# Patient Record
Sex: Female | Born: 1960 | Race: White | Hispanic: No | Marital: Married | Smoking: Never smoker
Health system: Southern US, Community
[De-identification: ages and names within clinical notes are randomized; demographics above are authoritative.]

## PROBLEM LIST (undated history)

## (undated) DIAGNOSIS — IMO0002 Reserved for concepts with insufficient information to code with codable children: Secondary | ICD-10-CM

## (undated) DIAGNOSIS — N390 Urinary tract infection, site not specified: Secondary | ICD-10-CM

## (undated) DIAGNOSIS — E079 Disorder of thyroid, unspecified: Secondary | ICD-10-CM

## (undated) DIAGNOSIS — K760 Fatty (change of) liver, not elsewhere classified: Secondary | ICD-10-CM

## (undated) DIAGNOSIS — N8189 Other female genital prolapse: Secondary | ICD-10-CM

## (undated) DIAGNOSIS — A499 Bacterial infection, unspecified: Secondary | ICD-10-CM

## (undated) DIAGNOSIS — R5383 Other fatigue: Secondary | ICD-10-CM

## (undated) HISTORY — DX: Other female genital prolapse: N81.89

## (undated) HISTORY — DX: Reserved for concepts with insufficient information to code with codable children: IMO0002

## (undated) HISTORY — DX: Other fatigue: R53.83

## (undated) HISTORY — PX: OTHER SURGICAL HISTORY: SHX169

## (undated) HISTORY — DX: Disorder of thyroid, unspecified: E07.9

## (undated) HISTORY — PX: HERNIA REPAIR: SHX51

## (undated) HISTORY — PX: TUBAL LIGATION: SHX77

## (undated) HISTORY — PX: ABDOMINAL HYSTERECTOMY: SHX81

## (undated) HISTORY — DX: Fatty (change of) liver, not elsewhere classified: K76.0

## (undated) HISTORY — PX: WISDOM TOOTH EXTRACTION: SHX21

---

## 1999-04-07 ENCOUNTER — Ambulatory Visit: Admission: RE | Admit: 1999-04-07 | Discharge: 1999-04-07 | Payer: Self-pay | Admitting: Gynecology

## 1999-04-13 ENCOUNTER — Encounter (INDEPENDENT_AMBULATORY_CARE_PROVIDER_SITE_OTHER): Payer: Self-pay

## 1999-04-13 ENCOUNTER — Inpatient Hospital Stay (HOSPITAL_COMMUNITY): Admission: RE | Admit: 1999-04-13 | Discharge: 1999-04-17 | Payer: Self-pay | Admitting: Gynecology

## 1999-05-25 ENCOUNTER — Ambulatory Visit: Admission: RE | Admit: 1999-05-25 | Discharge: 1999-05-25 | Payer: Self-pay | Admitting: Gynecology

## 2002-04-29 ENCOUNTER — Other Ambulatory Visit: Admission: RE | Admit: 2002-04-29 | Discharge: 2002-04-29 | Payer: Self-pay | Admitting: Dermatology

## 2004-10-22 ENCOUNTER — Ambulatory Visit (HOSPITAL_COMMUNITY): Admission: RE | Admit: 2004-10-22 | Discharge: 2004-10-22 | Payer: Self-pay | Admitting: Family Medicine

## 2004-11-02 ENCOUNTER — Ambulatory Visit: Payer: Self-pay | Admitting: Internal Medicine

## 2004-11-09 ENCOUNTER — Ambulatory Visit (HOSPITAL_COMMUNITY): Admission: RE | Admit: 2004-11-09 | Discharge: 2004-11-09 | Payer: Self-pay | Admitting: Internal Medicine

## 2004-11-09 ENCOUNTER — Ambulatory Visit: Payer: Self-pay | Admitting: Internal Medicine

## 2004-12-30 ENCOUNTER — Ambulatory Visit (HOSPITAL_COMMUNITY): Admission: RE | Admit: 2004-12-30 | Discharge: 2004-12-30 | Payer: Self-pay | Admitting: Family Medicine

## 2007-01-19 ENCOUNTER — Ambulatory Visit (HOSPITAL_COMMUNITY): Admission: RE | Admit: 2007-01-19 | Discharge: 2007-01-19 | Payer: Self-pay | Admitting: Obstetrics and Gynecology

## 2009-07-16 ENCOUNTER — Ambulatory Visit (HOSPITAL_COMMUNITY): Admission: RE | Admit: 2009-07-16 | Discharge: 2009-07-16 | Payer: Self-pay | Admitting: Obstetrics and Gynecology

## 2010-01-25 ENCOUNTER — Ambulatory Visit (HOSPITAL_COMMUNITY): Admission: RE | Admit: 2010-01-25 | Discharge: 2010-01-25 | Payer: Self-pay | Admitting: Internal Medicine

## 2010-02-01 ENCOUNTER — Ambulatory Visit (HOSPITAL_COMMUNITY): Admission: RE | Admit: 2010-02-01 | Discharge: 2010-02-01 | Payer: Self-pay | Admitting: Internal Medicine

## 2010-05-03 ENCOUNTER — Ambulatory Visit (HOSPITAL_COMMUNITY)
Admission: RE | Admit: 2010-05-03 | Discharge: 2010-05-03 | Payer: Self-pay | Source: Home / Self Care | Admitting: Family Medicine

## 2010-05-14 ENCOUNTER — Ambulatory Visit (HOSPITAL_COMMUNITY)
Admission: RE | Admit: 2010-05-14 | Discharge: 2010-05-14 | Payer: Self-pay | Source: Home / Self Care | Attending: General Surgery | Admitting: General Surgery

## 2010-08-16 LAB — CBC
HCT: 42.2 % (ref 36.0–46.0)
Hemoglobin: 14.3 g/dL (ref 12.0–15.0)
MCV: 87 fL (ref 78.0–100.0)
WBC: 10.1 10*3/uL (ref 4.0–10.5)

## 2010-08-16 LAB — BASIC METABOLIC PANEL
BUN: 19 mg/dL (ref 6–23)
Chloride: 100 mEq/L (ref 96–112)
GFR calc non Af Amer: 58 mL/min — ABNORMAL LOW (ref >60)
Potassium: 4.7 mEq/L (ref 3.5–5.1)
Sodium: 138 mEq/L (ref 135–145)

## 2010-08-16 LAB — SURGICAL PCR SCREEN: MRSA, PCR: NEGATIVE

## 2010-10-22 NOTE — Consult Note (Signed)
Gwendolyn Sweeney, Gwendolyn Sweeney               ACCOUNT NO.:  [id number]   MEDICAL RECORD NO.:  [id number]          PATIENT TYPE:  AMB   LOCATION:  DAY                           FACILITY:  [facility]   PHYSICIAN:  Lionel December, M.D.    DATE OF BIRTH:  December 23, 1960   DATE OF CONSULTATION:  11/02/2004  DATE OF DISCHARGE:                                   CONSULTATION   REASON FOR CONSULTATION:  Hemoccult positive stool.   HISTORY OF PRESENT ILLNESS:  Gwendolyn Sweeney is a 50 year old Caucasian female  who, around May 1, developed five days of severe upper abdominal pain which  she describes as an aching or hurting along with nausea and vomiting,  severe epigastric  and dark melanotic stools.  She was seen by Dr. Regino Schultze,  and stool was hemoccult positive.  She was felt to have a urinary tract  infection as well, and was started on antibiotics for this.  She denied any  diarrhea.  She did have significant fatigue and anorexia at that time.  She  was started on Nexium 40 mg b.i.d. which she took for several days, but has  since discontinued.  Her appetite has gradually  become somewhat better.  She denies any further melena, and her malaise and fatigue is improving  somewhat.  She does continue to have intermittent nausea.  Her weight has  remained stable.   PAST MEDICAL HISTORY:  1.  Arthritis with hip pain.  2.  Melanoma to the left cervical neck.  3.  Bilateral carpal tunnel repair.  4.  Wisdom teeth extraction.  5.  Tubal ligation.  6.  Hysterectomy, about five years ago.  She had a unilateral oophorectomy      secondary to tubal pregnancy and ovarian cyst.  She does not remember      which ovary was removed.   CURRENT MEDICATIONS:  1.  Vitamins daily.  2.  Os-Cal b.i.d.  3.  Nexium 40 mg daily.  4.  Ibuprofen 200 mg tablets p.r.n.   ALLERGIES:  No known drug allergies.   FAMILY HISTORY:  No known family history of colorectal carcinoma.  Her  mother does have history of peptic ulcer disease, and  is in her early 1's.  Father deceased at age 47 secondary to coronary artery disease and MI.  She  has one healthy sister and brother.   SOCIAL HISTORY:  Gwendolyn Sweeney has been married for 28 years.  She has three  children ages 34, 76, 26.  She a self employed Visual merchandiser, and also Production designer, theatre/television/film of [employer].  She denies any tobacco, alcohol or drug use.   REVIEW OF SYSTEMS:  CONSTITUTIONAL:  Weight is stable.  Denies any anorexia.  Fatigue is getting much better.  Denies any fever or chills.  CARDIOVASCULAR:  Denies any chest pain or palpitations.  PULMONARY:  Denies  any shortness of breath, dyspnea, cough or hemoptysis.  GI:  See HPI.  Denies any heartburn or indigestion, dysphagia or odynophagia at this time.   PHYSICAL EXAMINATION:  VITAL SIGNS:  Weight 178.5 pounds, height 59 inches,  temperature 97.9, blood pressure 120/64, pulse 78.  GENERAL APPEARANCE:  Gwendolyn Sweeney is a 50 year old well-developed, well-  nourished Caucasian female in no acute distress.  HEENT:  Sclerae are clear, nonicteric.  Conjunctivae are pink.  Oropharynx  pink and moist without any lesions.  NECK:  Supple without any mass or thyromegaly.  CHEST:  Heart regular rate and rhythm with normal S1, S2, without any  murmurs, clicks, rubs or gallops.  LUNGS:  Clear to auscultation bilaterally.  ABDOMEN:  Positive bowel sounds x4.  No bruits auscultated.  Soft,  nontender, nondistended without any palpable mass or hepatosplenomegaly.  No  rebound tenderness or guarding.  EXTREMITIES:  2+ pedal pulses bilaterally.  No edema.  SKIN:  Pink, warm and dry without any rash or jaundice.   LABORATORY STUDIES:  She had an upper abdominal ultrasound on Oct 22, 2004,  which was normal.  CBC from Oct 19, 2004, WBC 11.1, hemoglobin 14.3,  hematocrit 43.6.  Sodium 134, potassium 3.8, chloride 96, CO2 29, glucose  89, BUN 22, creatinine 1.1, total bilirubin 0.5, alk-phos 67, AST 27, ALT  38, total protein 7.6, albumin 4.4,  calcium 9.3.   ASSESSMENT:  Gwendolyn Sweeney is a 50 year old Caucasian female with an acute  illness of five days duration earlier this month.  She had sudden onset  upper abdominal pain, severe nausea and vomiting, __________ and melena.  I  suspect she had either gastroenteritis or food borne illness.  Hemoccult  positive stool may have been the result of this, or she may have developed a  Mallory-Weiss tear.  Given her hemoccult positive stool, however, she would  like to proceed with colonoscopy and EGD to rule out peptic ulcer disease or  lower GI tract bleeding.   RECOMMENDATIONS:  1.  She should resume her Nexium 40 mg daily for now.  2.  Will scheduled colonoscopy and EGD with Dr Karilyn Cota in the near future.  I      have discussed both procedures including risks and benefits including      but not limited to bleeding, infection, perforation, drug reaction.  She      agrees with this plan.  Consent will be obtained.  3.  Further recommendations pending procedures.   We would like to thank Dr. Regino Schultze for allowing [place] to participate in the  care of Gwendolyn Sweeney.       KC/MEDQ  D:  11/03/2004  T:  11/03/2004  Job:  [id number]

## 2010-10-22 NOTE — Op Note (Signed)
Gwendolyn Sweeney, Gwendolyn Sweeney               ACCOUNT NO.:  [id number]   MEDICAL RECORD NO.:  [id number]          PATIENT TYPE:  AMB   LOCATION:  DAY                           FACILITY:  [facility]   PHYSICIAN:  Lionel December, M.D.    DATE OF BIRTH:  31-Dec-1960   DATE OF PROCEDURE:  11/09/2004  DATE OF DISCHARGE:                                 OPERATIVE REPORT   PROCEDURE:  Esophagogastroduodenoscopy followed by colonoscopy.   INDICATION:  Gwendolyn Sweeney is a 50 year old Caucasian female who recently had dark  stools for two days. She also has had epigastric pain. She uses NSAIDs on  p.r.n. basis. She was seen by Dr. Regino Schultze. Her stool was not black but was  positive. She was therefore referred for diagnostic evaluation. She took  Nexium for awhile, but she is not taking it any more. She is on Voltaren  which she uses sporadically for hip arthritis.   Procedure and risks were reviewed with the patient, and informed consent was  obtained.   PREMEDICATION:  Cetacaine spray for pharyngeal topical anesthesia, Demerol  25 mg IV, Versed 8 mg IV.   FINDINGS:  Procedures performed in endoscopy suite. The patient's vital  signs and O2 saturation were monitored during the procedure and remained  stable. The patient was placed in left lateral position and remained stable.   PROCEDURE #1:  Esophagogastroduodenoscopy. The patient was placed in left  lateral position and Olympus videoscope was passed via oropharynx without  any difficulty into esophagus.   Esophagus. Mucosa of the esophagus was normal throughout. GE junction was  located at 37 cm.   Stomach. It was empty and distended very well insufflation. Folds of  proximal stomach were normal. Examination of mucosa revealed a small ulcer  surrounded by converging folds and scarring consistent with partially healed  ulcer. Antral erosions were noted as well. Pyloric channel was patent.  Angularis, fundus and cardia were examined by retroflexing the scope and  were normal.   Duodenum. Bulbar mucosa was normal. Scope was passed to the second part of  duodenum where mucosa and folds were normal. Endoscope was withdrawn, and  the patient prepared for procedure #2.   PROCEDURE #2:  Colonoscopy. The rectal examination performed. No abnormality  noted on external or digital exam. Olympus videoscope was placed in rectum  and advanced under vision into sigmoid colon and beyond. Preparation was  excellent. Scope was passed to cecum which was identified by appendiceal  orifice and ileocecal valve. Pictures taken for the record. As the scope was  withdrawn, colonic mucosa was carefully examined and was normal throughout.  Rectal mucosa similarly was normal. Scope was retroflexed to examine  anorectal junction, and small hemorrhoids were noted below the dentate line.  Endoscope was then withdrawn. The patient tolerated the procedures well.   FINAL DIAGNOSIS:  Healing gastric ulcer at antrum along with antral  erosions. Suspect NSAID-induced injury, but H pylori infection needs to be  ruled out.   Normal colonoscopy except for small external hemorrhoids.   RECOMMENDATIONS:  1.  The patient advised to go back on Nexium 40 mg  p.o. q.a.m. and stay on      it for at least two weeks; however, if she has take NSAID frequently or      daily, she should stay on Nexium indefinitely. Prescription given for 30      with 11 refills.  2.  H pylori serology will be checked today, and we will contact the patient      with results.       NR/MEDQ  D:  11/09/2004  T:  11/09/2004  Job:  [id number]   cc:   Kirk Ruths, M.D.  [address]  [place]  [place] [address]  Fax: [phone]

## 2012-02-01 ENCOUNTER — Other Ambulatory Visit: Payer: Self-pay | Admitting: Adult Health

## 2012-02-01 DIAGNOSIS — Z139 Encounter for screening, unspecified: Secondary | ICD-10-CM

## 2012-02-13 ENCOUNTER — Ambulatory Visit (HOSPITAL_COMMUNITY): Payer: Self-pay

## 2012-02-20 ENCOUNTER — Ambulatory Visit (HOSPITAL_COMMUNITY): Payer: Self-pay

## 2012-03-05 ENCOUNTER — Ambulatory Visit (HOSPITAL_COMMUNITY)
Admission: RE | Admit: 2012-03-05 | Discharge: 2012-03-05 | Disposition: A | Discharge status: Home / Self Care | Payer: PRIVATE HEALTH INSURANCE | Source: Ambulatory Visit | Attending: Family Medicine | Admitting: Family Medicine

## 2012-03-05 ENCOUNTER — Other Ambulatory Visit (HOSPITAL_COMMUNITY): Payer: Self-pay | Admitting: Family Medicine

## 2012-03-05 DIAGNOSIS — R059 Cough, unspecified: Secondary | ICD-10-CM

## 2012-03-05 DIAGNOSIS — J069 Acute upper respiratory infection, unspecified: Secondary | ICD-10-CM

## 2012-03-05 DIAGNOSIS — R0989 Other specified symptoms and signs involving the circulatory and respiratory systems: Secondary | ICD-10-CM | POA: Insufficient documentation

## 2012-03-05 DIAGNOSIS — R05 Cough: Secondary | ICD-10-CM

## 2012-05-15 ENCOUNTER — Ambulatory Visit (HOSPITAL_COMMUNITY)
Admission: RE | Admit: 2012-05-15 | Discharge: 2012-05-15 | Disposition: A | Discharge status: Home / Self Care | Payer: PRIVATE HEALTH INSURANCE | Source: Ambulatory Visit | Attending: Adult Health | Admitting: Adult Health

## 2012-05-15 DIAGNOSIS — Z139 Encounter for screening, unspecified: Secondary | ICD-10-CM

## 2012-05-15 DIAGNOSIS — Z1231 Encounter for screening mammogram for malignant neoplasm of breast: Secondary | ICD-10-CM | POA: Insufficient documentation

## 2012-05-17 ENCOUNTER — Ambulatory Visit (HOSPITAL_COMMUNITY): Payer: PRIVATE HEALTH INSURANCE

## 2012-10-17 ENCOUNTER — Other Ambulatory Visit: Payer: Self-pay | Admitting: Adult Health

## 2012-10-17 MED ORDER — SULFAMETHOXAZOLE-TRIMETHOPRIM 800-160 MG PO TABS: 1.0000 | ORAL_TABLET | Freq: Two times a day (BID) | ORAL | Status: DC

## 2013-01-16 ENCOUNTER — Other Ambulatory Visit: Payer: Self-pay | Admitting: *Deleted

## 2013-01-16 MED ORDER — ZOLPIDEM TARTRATE 5 MG PO TABS: 5.0000 mg | ORAL_TABLET | Freq: Every evening | ORAL | Status: DC | PRN

## 2013-04-08 ENCOUNTER — Ambulatory Visit (INDEPENDENT_AMBULATORY_CARE_PROVIDER_SITE_OTHER): Payer: PRIVATE HEALTH INSURANCE | Admitting: Adult Health

## 2013-04-08 ENCOUNTER — Encounter: Payer: Self-pay | Admitting: Adult Health

## 2013-04-08 ENCOUNTER — Encounter (INDEPENDENT_AMBULATORY_CARE_PROVIDER_SITE_OTHER): Payer: Self-pay

## 2013-04-08 VITALS — BP 132/84 | HR 72 | Ht 60.0 in | Wt 184.5 lb

## 2013-04-08 DIAGNOSIS — Z1212 Encounter for screening for malignant neoplasm of rectum: Secondary | ICD-10-CM

## 2013-04-08 DIAGNOSIS — R5383 Other fatigue: Secondary | ICD-10-CM

## 2013-04-08 DIAGNOSIS — Z01419 Encounter for gynecological examination (general) (routine) without abnormal findings: Secondary | ICD-10-CM

## 2013-04-08 DIAGNOSIS — Z1389 Encounter for screening for other disorder: Secondary | ICD-10-CM

## 2013-04-08 HISTORY — DX: Other fatigue: R53.83

## 2013-04-08 LAB — HEMOCCULT GUIAC POC 1CARD (OFFICE): Fecal Occult Blood, POC: NEGATIVE

## 2013-04-08 LAB — COMPREHENSIVE METABOLIC PANEL
AST: 21 U/L (ref 0–37)
BUN: 15 mg/dL (ref 6–23)
Calcium: 9.6 mg/dL (ref 8.4–10.5)
Chloride: 103 mEq/L (ref 96–112)
Creat: 1.03 mg/dL (ref 0.50–1.10)
Total Bilirubin: 0.4 mg/dL (ref 0.3–1.2)

## 2013-04-08 LAB — CBC
HCT: 40.2 % (ref 36.0–46.0)
MCH: 28.5 pg (ref 26.0–34.0)
MCV: 86.1 fL (ref 78.0–100.0)
RDW: 13.9 % (ref 11.5–15.5)
WBC: 9.9 10*3/uL (ref 4.0–10.5)

## 2013-04-08 LAB — LIPID PANEL
HDL: 54 mg/dL (ref >39)
LDL Cholesterol: 101 mg/dL — ABNORMAL HIGH (ref 0–99)
Total CHOL/HDL Ratio: 3.2 Ratio
Triglycerides: 96 mg/dL (ref <150)

## 2013-04-08 LAB — POCT URINALYSIS DIPSTICK
Blood, UA: NEGATIVE
Protein, UA: NEGATIVE

## 2013-04-08 NOTE — Progress Notes (Signed)
Patient ID: Gwendolyn Sweeney, female   DOB: [birth year], 52 y.o.   MRN: [id number] History of Present Illness: Gwendolyn Sweeney is a 52 year old white female married in for physical.She is complaining of fatigue but has just finished tobacco season.   Current Medications, Allergies, Past Medical History, Past Surgical History, Family History and Social History were reviewed in [employer] record.     Review of Systems: Patient denies any headaches, blurred vision, shortness of breath, chest pain, abdominal pain, problems with bowel movements, urination, or intercourse. She has pain joints of fingers,occasional hot flash, no mood changes.Feels tired.    Physical Exam:BP 132/84  Pulse 72  Ht 5' (1.524 m)  Wt 184 lb 8 oz (83.689 kg)  BMI 36.03 kg/m2 General:  Well developed, well nourished, no acute distress Skin:  Warm and dry Neck:  Midline trachea, normal thyroid Lungs; Clear to auscultation bilaterally Breast:  No dominant palpable mass, retraction, or nipple discharge Cardiovascular: Regular rate and rhythm Abdomen:  Soft, non tender, no hepatosplenomegaly,obese Pelvic:  External genitalia is normal in appearance.  The vagina is normal in appearance.  The cervix and uterus are absent.  No  adnexal masses or tenderness noted. Rectal: Good sphincter tone, no polyps, or hemorrhoids felt.  Hemoccult negative. Extremities:  No swelling or varicosities noted in feet and legs, she has some swelling in fingers and stiffness in several fingers, but declines referral at this time Psych:  No mood changes, alert and cooperative   Impression: Yearly gyn exam Fatigue  Plan: Physical in 1 year Mammogram yearly Colonoscopy as per GI Check CBC,CMP,TSH and lipids Follow up labs in am by phone

## 2013-04-08 NOTE — Patient Instructions (Signed)
Physical in 1 year Mammogram yearly  Colonoscopy due Follow up labs in am

## 2013-04-09 ENCOUNTER — Telehealth: Payer: Self-pay | Admitting: Adult Health

## 2013-04-09 LAB — TSH: TSH: 2.803 u[IU]/mL (ref 0.350–4.500)

## 2013-04-09 NOTE — Telephone Encounter (Signed)
Left message labs good will mail copy 

## 2013-04-10 ENCOUNTER — Telehealth: Payer: Self-pay | Admitting: Adult Health

## 2013-04-10 NOTE — Telephone Encounter (Signed)
Pt aware of labs will mail copy to her and fax to Dr Regino Schultze

## 2013-07-19 ENCOUNTER — Other Ambulatory Visit: Payer: Self-pay | Admitting: Obstetrics and Gynecology

## 2013-07-19 DIAGNOSIS — Z1231 Encounter for screening mammogram for malignant neoplasm of breast: Secondary | ICD-10-CM

## 2013-07-23 ENCOUNTER — Ambulatory Visit (HOSPITAL_COMMUNITY): Payer: PRIVATE HEALTH INSURANCE

## 2013-07-26 ENCOUNTER — Ambulatory Visit (HOSPITAL_COMMUNITY)
Admission: RE | Admit: 2013-07-26 | Discharge: 2013-07-26 | Disposition: A | Discharge status: Home / Self Care | Payer: PRIVATE HEALTH INSURANCE | Source: Ambulatory Visit | Attending: Obstetrics and Gynecology | Admitting: Obstetrics and Gynecology

## 2013-07-26 DIAGNOSIS — Z1231 Encounter for screening mammogram for malignant neoplasm of breast: Secondary | ICD-10-CM | POA: Insufficient documentation

## 2013-12-20 ENCOUNTER — Telehealth: Payer: Self-pay | Admitting: Adult Health

## 2013-12-20 MED ORDER — ZOLPIDEM TARTRATE 5 MG PO TABS: 5.0000 mg | ORAL_TABLET | Freq: Every evening | ORAL | Status: DC | PRN

## 2013-12-20 MED ORDER — AZITHROMYCIN 250 MG PO TABS: ORAL_TABLET | ORAL | Status: DC

## 2013-12-20 NOTE — Telephone Encounter (Signed)
Needs refill on Ambien and complains of sinus infection, teeth hurt and eyes puffy will Rx Z pack and refill Lorrin Mais

## 2014-04-07 ENCOUNTER — Encounter: Payer: Self-pay | Admitting: Adult Health

## 2014-12-10 ENCOUNTER — Other Ambulatory Visit (HOSPITAL_COMMUNITY): Payer: Self-pay | Admitting: Family Medicine

## 2014-12-10 DIAGNOSIS — Z1231 Encounter for screening mammogram for malignant neoplasm of breast: Secondary | ICD-10-CM

## 2014-12-17 ENCOUNTER — Ambulatory Visit (HOSPITAL_COMMUNITY): Payer: PRIVATE HEALTH INSURANCE

## 2014-12-25 ENCOUNTER — Inpatient Hospital Stay (HOSPITAL_COMMUNITY): Admission: RE | Admit: 2014-12-25 | Payer: PRIVATE HEALTH INSURANCE | Source: Ambulatory Visit

## 2015-01-12 ENCOUNTER — Telehealth: Payer: Self-pay | Admitting: *Deleted

## 2015-01-12 MED ORDER — SULFAMETHOXAZOLE-TRIMETHOPRIM 800-160 MG PO TABS: 1.0000 | ORAL_TABLET | Freq: Two times a day (BID) | ORAL | Status: DC

## 2015-01-12 MED ORDER — NYSTATIN-TRIAMCINOLONE 100000-0.1 UNIT/GM-% EX CREA: 1.0000 "application " | TOPICAL_CREAM | Freq: Two times a day (BID) | CUTANEOUS | Status: DC

## 2015-01-12 NOTE — Telephone Encounter (Signed)
Has burning with urination and vaginal irritation, can't come in will rx septra ds and mytrex, if not better will have to come in

## 2015-01-12 NOTE — Telephone Encounter (Signed)
Spoke with pt. I advised she would need to be seen but pt states she has been out of work lately and she can't take off. Please advise. Thanks!! [place]

## 2015-02-18 ENCOUNTER — Ambulatory Visit (HOSPITAL_COMMUNITY)
Admission: RE | Admit: 2015-02-18 | Discharge: 2015-02-18 | Disposition: A | Discharge status: Home / Self Care | Payer: PRIVATE HEALTH INSURANCE | Source: Ambulatory Visit | Attending: Family Medicine | Admitting: Family Medicine

## 2015-02-18 ENCOUNTER — Ambulatory Visit (HOSPITAL_COMMUNITY): Payer: PRIVATE HEALTH INSURANCE

## 2015-02-18 ENCOUNTER — Other Ambulatory Visit (HOSPITAL_COMMUNITY): Payer: Self-pay | Admitting: Family Medicine

## 2015-02-18 DIAGNOSIS — Z1231 Encounter for screening mammogram for malignant neoplasm of breast: Secondary | ICD-10-CM | POA: Diagnosis not present

## 2015-03-23 ENCOUNTER — Encounter: Payer: Self-pay | Admitting: Adult Health

## 2015-03-23 ENCOUNTER — Ambulatory Visit (INDEPENDENT_AMBULATORY_CARE_PROVIDER_SITE_OTHER): Payer: PRIVATE HEALTH INSURANCE | Admitting: Adult Health

## 2015-03-23 VITALS — BP 134/88 | HR 68 | Ht 59.5 in | Wt 183.5 lb

## 2015-03-23 DIAGNOSIS — Z1212 Encounter for screening for malignant neoplasm of rectum: Secondary | ICD-10-CM

## 2015-03-23 DIAGNOSIS — Z01419 Encounter for gynecological examination (general) (routine) without abnormal findings: Secondary | ICD-10-CM

## 2015-03-23 DIAGNOSIS — N8189 Other female genital prolapse: Secondary | ICD-10-CM

## 2015-03-23 HISTORY — DX: Other female genital prolapse: N81.89

## 2015-03-23 LAB — HEMOCCULT GUIAC POC 1CARD (OFFICE): Fecal Occult Blood, POC: NEGATIVE

## 2015-03-23 NOTE — Patient Instructions (Signed)
Physical in 1 year Mammogram yearly Colonoscopy per GI 

## 2015-03-23 NOTE — Progress Notes (Signed)
Patient ID: Gwendolyn Sweeney, female   DOB: 1960-10-18, 54 y.o.   MRN: [id number] History of Present Illness: Gwendolyn Sweeney is a 54 year old white female,married in for a well woman gyn exam, she is sp hysterectomy. PCP is Dr Hilma Favors.  Current Medications, Allergies, Past Medical History, Past Surgical History, Family History and Social History were reviewed in [employer] record.     Review of Systems: Patient denies any headaches, hearing loss, fatigue, blurred vision, shortness of breath, chest pain, abdominal pain, problems with bowel movements,  or intercourse. No joint pain or mood swings. Has had frequent UTIs, and voids often,has some pain in right shoulder at times, but has been on tractor lately with tobacco harvesting, has some back and leg aches at times.She says still uses Ambien on occasion and it helps.   Physical Exam:BP 134/88 mmHg  Pulse 68  Ht 4' 11.5" (1.511 m)  Wt 183 lb 8 oz (83.235 kg)  BMI 36.46 kg/m2 General:  Well developed, well nourished, no acute distress Skin:  Warm and dry Neck:  Midline trachea, normal thyroid, good ROM, no lymphadenopathy Lungs; Clear to auscultation bilaterally Breast:  No dominant palpable mass, retraction, or nipple discharge Cardiovascular: Regular rate and rhythm Abdomen:  Soft, non tender, no hepatosplenomegaly Pelvic:  External genitalia is normal in appearance, no lesions.  The vagina has decreased color, moisture and rugae and she has pelvic relaxation. Urethra has no lesions or masses. The cervix and uterus are absent. No adnexal masses or tenderness noted.Bladder is non tender, no masses felt. Rectal: Good sphincter tone, no polyps, or hemorrhoids felt.  Hemoccult negative.+rectocele Extremities/musculoskeletal:  No swelling or varicosities noted, no clubbing or cyanosis Psych:  No mood changes, alert and cooperative,seems happy Discussed pelvic relaxation with her and pessary use,instead of  surgery.  Impression:  Well woman gyn exam no pap Pelvic relaxation    Plan: Check CBC,CMP,TSH and lipids Physical in 1 year Mammogram yearly Colonoscopy per GI Call if wants to try a pessary

## 2015-03-24 ENCOUNTER — Telehealth: Payer: Self-pay | Admitting: Adult Health

## 2015-03-24 LAB — COMPREHENSIVE METABOLIC PANEL
ALBUMIN: 4.4 g/dL (ref 3.5–5.5)
ALK PHOS: 69 IU/L (ref 39–117)
ALT: 34 IU/L — AB (ref 0–32)
AST: 30 IU/L (ref 0–40)
Albumin/Globulin Ratio: 1.5 (ref 1.1–2.5)
BUN/Creatinine Ratio: 16 (ref 9–23)
BUN: 16 mg/dL (ref 6–24)
Bilirubin Total: 0.4 mg/dL (ref 0.0–1.2)
CO2: 27 mmol/L (ref 18–29)
CREATININE: 0.97 mg/dL (ref 0.57–1.00)
Calcium: 9.5 mg/dL (ref 8.7–10.2)
Chloride: 99 mmol/L (ref 97–106)
GFR, EST AFRICAN AMERICAN: 76 mL/min/{1.73_m2} (ref >59)
GFR, EST NON AFRICAN AMERICAN: 65 mL/min/{1.73_m2} (ref >59)
GLOBULIN, TOTAL: 2.9 g/dL (ref 1.5–4.5)
Glucose: 91 mg/dL (ref 65–99)
Potassium: 4.4 mmol/L (ref 3.5–5.2)
Sodium: 141 mmol/L (ref 136–144)
Total Protein: 7.3 g/dL (ref 6.0–8.5)

## 2015-03-24 LAB — CBC
HEMATOCRIT: 40.8 % (ref 34.0–46.6)
Hemoglobin: 13.4 g/dL (ref 11.1–15.9)
MCH: 28.5 pg (ref 26.6–33.0)
MCHC: 32.8 g/dL (ref 31.5–35.7)
MCV: 87 fL (ref 79–97)
Platelets: 311 10*3/uL (ref 150–379)
RBC: 4.71 x10E6/uL (ref 3.77–5.28)
RDW: 14.8 % (ref 12.3–15.4)
WBC: 8.4 10*3/uL (ref 3.4–10.8)

## 2015-03-24 LAB — LIPID PANEL
CHOL/HDL RATIO: 2.5 ratio (ref 0.0–4.4)
CHOLESTEROL TOTAL: 171 mg/dL (ref 100–199)
HDL: 69 mg/dL (ref >39)
LDL CALC: 88 mg/dL (ref 0–99)
TRIGLYCERIDES: 70 mg/dL (ref 0–149)
VLDL Cholesterol Cal: 14 mg/dL (ref 5–40)

## 2015-03-24 LAB — TSH: TSH: 3.52 u[IU]/mL (ref 0.450–4.500)

## 2015-03-24 NOTE — Telephone Encounter (Signed)
Pt aware labs normal  

## 2015-05-06 ENCOUNTER — Telehealth: Payer: Self-pay | Admitting: Adult Health

## 2015-05-06 NOTE — Telephone Encounter (Signed)
Told pt may want to try pessary now, she will think about it and call for appt

## 2015-05-18 ENCOUNTER — Encounter (INDEPENDENT_AMBULATORY_CARE_PROVIDER_SITE_OTHER): Payer: Self-pay | Admitting: *Deleted

## 2015-07-10 ENCOUNTER — Telehealth: Payer: Self-pay | Admitting: *Deleted

## 2015-07-10 MED ORDER — ZOLPIDEM TARTRATE 5 MG PO TABS: 5.0000 mg | ORAL_TABLET | Freq: Every evening | ORAL | Status: DC | PRN

## 2015-07-10 MED ORDER — ESTRADIOL 1 MG PO TABS: 1.0000 mg | ORAL_TABLET | Freq: Every day | ORAL | Status: DC

## 2015-07-10 NOTE — Telephone Encounter (Signed)
Having hot flashes, decreased libido and not sleeping well wants to try ET and refill ambien, she is aware of risk and benefits of ET

## 2015-10-14 ENCOUNTER — Other Ambulatory Visit (INDEPENDENT_AMBULATORY_CARE_PROVIDER_SITE_OTHER): Payer: Self-pay | Admitting: *Deleted

## 2015-10-14 DIAGNOSIS — Z1211 Encounter for screening for malignant neoplasm of colon: Secondary | ICD-10-CM

## 2015-12-09 ENCOUNTER — Other Ambulatory Visit (INDEPENDENT_AMBULATORY_CARE_PROVIDER_SITE_OTHER): Payer: Self-pay | Admitting: *Deleted

## 2015-12-09 ENCOUNTER — Encounter (INDEPENDENT_AMBULATORY_CARE_PROVIDER_SITE_OTHER): Payer: Self-pay | Admitting: *Deleted

## 2015-12-09 NOTE — Telephone Encounter (Signed)
Patient needs trilyte 

## 2015-12-10 MED ORDER — PEG 3350-KCL-NA BICARB-NACL 420 G PO SOLR: 4000.0000 mL | Freq: Once | ORAL | Status: DC

## 2015-12-23 ENCOUNTER — Telehealth (INDEPENDENT_AMBULATORY_CARE_PROVIDER_SITE_OTHER): Payer: Self-pay | Admitting: *Deleted

## 2015-12-23 NOTE — Telephone Encounter (Signed)
Referring MD/PCP: koberlein   Procedure: tcs  Reason/Indication:  screening  Has patient had this procedure before?  Yes, 2006 -- epic  If so, when, by whom and where?    Is there a family history of colon cancer?  no  Who?  What age when diagnosed?    Is patient diabetic?   no      Does patient have prosthetic heart valve or mechanical valve?  no  Do you have a pacemaker?  no  Has patient ever had endocarditis? no  Has patient had joint replacement within last 12 months?  no  Does patient tend to be constipated or take laxatives? no  Does patient have a history of alcohol/drug use?  no  Is patient on Coumadin, Plavix and/or Aspirin? no  Medications: none  Allergies: nkda  Medication Adjustment:   Procedure date & time: 01/20/16 at 830

## 2015-12-23 NOTE — Telephone Encounter (Signed)
agree

## 2016-01-20 ENCOUNTER — Encounter (HOSPITAL_COMMUNITY)
Admission: RE | Disposition: A | Discharge status: Home / Self Care | Payer: Self-pay | Source: Ambulatory Visit | Attending: Internal Medicine

## 2016-01-20 ENCOUNTER — Ambulatory Visit (HOSPITAL_COMMUNITY)
Admission: RE | Admit: 2016-01-20 | Discharge: 2016-01-20 | Disposition: A | Discharge status: Home / Self Care | Payer: PRIVATE HEALTH INSURANCE | Source: Ambulatory Visit | Attending: Internal Medicine | Admitting: Internal Medicine

## 2016-01-20 ENCOUNTER — Encounter (HOSPITAL_COMMUNITY): Payer: Self-pay | Admitting: *Deleted

## 2016-01-20 DIAGNOSIS — Z8711 Personal history of peptic ulcer disease: Secondary | ICD-10-CM | POA: Diagnosis not present

## 2016-01-20 DIAGNOSIS — D125 Benign neoplasm of sigmoid colon: Secondary | ICD-10-CM | POA: Diagnosis not present

## 2016-01-20 DIAGNOSIS — Z1211 Encounter for screening for malignant neoplasm of colon: Secondary | ICD-10-CM | POA: Diagnosis not present

## 2016-01-20 DIAGNOSIS — Z79899 Other long term (current) drug therapy: Secondary | ICD-10-CM | POA: Diagnosis not present

## 2016-01-20 DIAGNOSIS — K5730 Diverticulosis of large intestine without perforation or abscess without bleeding: Secondary | ICD-10-CM

## 2016-01-20 HISTORY — DX: Bacterial infection, unspecified: A49.9

## 2016-01-20 HISTORY — PX: COLONOSCOPY: SHX5424

## 2016-01-20 HISTORY — DX: Urinary tract infection, site not specified: N39.0

## 2016-01-20 SURGERY — COLONOSCOPY
Anesthesia: Moderate Sedation

## 2016-01-20 MED ORDER — STERILE WATER FOR IRRIGATION IR SOLN
Status: DC | PRN
  Administered 2016-01-20: 100 mL

## 2016-01-20 MED ORDER — MIDAZOLAM HCL 5 MG/5ML IJ SOLN
INTRAMUSCULAR | Status: DC | PRN
  Administered 2016-01-20 (×3): 2 mg via INTRAVENOUS

## 2016-01-20 MED ORDER — MIDAZOLAM HCL 5 MG/5ML IJ SOLN
INTRAMUSCULAR | Status: AC
  Filled 2016-01-20: qty 10

## 2016-01-20 MED ORDER — SODIUM CHLORIDE 0.9 % IV SOLN
INTRAVENOUS | Status: DC
  Administered 2016-01-20: 1000 mL via INTRAVENOUS

## 2016-01-20 MED ORDER — MEPERIDINE HCL 50 MG/ML IJ SOLN
INTRAMUSCULAR | Status: DC | PRN
  Administered 2016-01-20 (×2): 25 mg via INTRAVENOUS

## 2016-01-20 MED ORDER — MEPERIDINE HCL 50 MG/ML IJ SOLN
INTRAMUSCULAR | Status: AC
  Filled 2016-01-20: qty 1

## 2016-01-20 NOTE — Discharge Instructions (Signed)
High-Fiber Diet Fiber, also called dietary fiber, is a type of carbohydrate found in fruits, vegetables, whole grains, and beans. A high-fiber diet can have many health benefits. Your health care provider may recommend a high-fiber diet to help:  Prevent constipation. Fiber can make your bowel movements more regular.  Lower your cholesterol.  Relieve hemorrhoids, uncomplicated diverticulosis, or irritable bowel syndrome.  Prevent overeating as part of a weight-loss plan.  Prevent heart disease, type 2 diabetes, and certain cancers. WHAT IS MY PLAN? The recommended daily intake of fiber includes:  38 grams for men under age 69.  28 grams for men over age 42.  16 grams for women under age 71.  86 grams for women over age 75. You can get the recommended daily intake of dietary fiber by eating a variety of fruits, vegetables, grains, and beans. Your health care provider may also recommend a fiber supplement if it is not possible to get enough fiber through your diet. WHAT DO I NEED TO KNOW ABOUT A HIGH-FIBER DIET?  Fiber supplements have not been widely studied for their effectiveness, so it is better to get fiber through food sources.  Always check the fiber content on thenutrition facts label of any prepackaged food. Look for foods that contain at least 5 grams of fiber per serving.  Ask your dietitian if you have questions about specific foods that are related to your condition, especially if those foods are not listed in the following section.  Increase your daily fiber consumption gradually. Increasing your intake of dietary fiber too quickly may cause bloating, cramping, or gas.  Drink plenty of water. Water helps you to digest fiber. WHAT FOODS CAN I EAT? Grains Whole-grain breads. Multigrain cereal. Oats and oatmeal. Brown rice. Barley. Bulgur wheat. [place]. Bran muffins. Popcorn. Rye wafer crackers. Vegetables Sweet potatoes. Spinach. Kale. Artichokes. Cabbage. Broccoli.  Green peas. Carrots. Squash. Fruits Berries. Pears. Apples. Oranges. Avocados. Prunes and raisins. Dried figs. Meats and Other Protein Sources [place], kidney, pinto, and soy beans. Split peas. Lentils. Nuts and seeds. Dairy Fiber-fortified yogurt. Beverages Fiber-fortified soy milk. Fiber-fortified orange juice. Other Fiber bars. The items listed above may not be a complete list of recommended foods or beverages. Contact your dietitian for more options. WHAT FOODS ARE NOT RECOMMENDED? Grains White bread. Pasta made with refined flour. White rice. Vegetables Fried potatoes. Canned vegetables. Well-cooked vegetables.  Fruits Fruit juice. Cooked, strained fruit. Meats and Other Protein Sources Fatty cuts of meat. Fried Sales executive or fried fish. Dairy Milk. Yogurt. Cream cheese. Sour cream. Beverages Soft drinks. Other Cakes and pastries. Butter and oils. The items listed above may not be a complete list of foods and beverages to avoid. Contact your dietitian for more information. WHAT ARE SOME TIPS FOR INCLUDING HIGH-FIBER FOODS IN MY DIET?  Eat a wide variety of high-fiber foods.  Make sure that half of all grains consumed each day are whole grains.  Replace breads and cereals made from refined flour or white flour with whole-grain breads and cereals.  Replace white rice with brown rice, bulgur wheat, or millet.  Start the day with a breakfast that is high in fiber, such as a cereal that contains at least 5 grams of fiber per serving.  Use beans in place of meat in soups, salads, or pasta.  Eat high-fiber snacks, such as berries, raw vegetables, nuts, or popcorn.   This information is not intended to replace advice given to you by your health care provider. Make sure you discuss  any questions you have with your health care provider.   Document Released: 05/23/2005 Document Revised: 06/13/2014 Document Reviewed: 11/05/2013 Elsevier Interactive Patient Education 2016 Elsevier  Inc. Colonoscopy, Care After Refer to this sheet in the next few weeks. These instructions provide you with information on caring for yourself after your procedure. Your health care provider may also give you more specific instructions. Your treatment has been planned according to current medical practices, but problems sometimes occur. Call your health care provider if you have any problems or questions after your procedure. WHAT TO EXPECT AFTER THE PROCEDURE  After your procedure, it is typical to have the following:  A small amount of blood in your stool.  Moderate amounts of gas and mild abdominal cramping or bloating. HOME CARE INSTRUCTIONS  Do not drive, operate machinery, or sign important documents for 24 hours.  You may shower and resume your regular physical activities, but move at a slower pace for the first 24 hours.  Take frequent rest periods for the first 24 hours.  Walk around or put a warm pack on your abdomen to help reduce abdominal cramping and bloating.  Drink enough fluids to keep your urine clear or pale yellow.  You may resume your normal diet as instructed by your health care provider. Avoid heavy or fried foods that are hard to digest.  Avoid drinking alcohol for 24 hours or as instructed by your health care provider.  Only take over-the-counter or prescription medicines as directed by your health care provider.  If a tissue sample (biopsy) was taken during your procedure:  Do not take aspirin or blood thinners for 7 days, or as instructed by your health care provider.  Do not drink alcohol for 7 days, or as instructed by your health care provider.  Eat soft foods for the first 24 hours. SEEK MEDICAL CARE IF: You have persistent spotting of blood in your stool 2-3 days after the procedure. SEEK IMMEDIATE MEDICAL CARE IF:  You have more than a small spotting of blood in your stool.  You pass large blood clots in your stool.  Your abdomen is swollen  (distended).  You have nausea or vomiting.  You have a fever.  You have increasing abdominal pain that is not relieved with medicine.   This information is not intended to replace advice given to you by your health care provider. Make sure you discuss any questions you have with your health care provider.   Document Released: 01/05/2004 Document Revised: 03/13/2013 Document Reviewed: 01/28/2013 Elsevier Interactive Patient Education 2016 [place] usual medications and high fiber diet. No driving for 24 hours. Physician will call with biopsy results.

## 2016-01-20 NOTE — H&P (Signed)
Gwendolyn Sweeney is an 55 y.o. female.   Chief Complaint: Patient is here for colonoscopy. HPI: Patient is 55 year old Caucasian female who is here for screening colonoscopy. She denies abdominal pain change in bowel habits or rectal bleeding. Last colonoscopy was normal in 2006. History is negative for CRC.  Past Medical History:  Diagnosis Date  . Fatigue 04/08/2013  . 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 Urinary tract bacterial infections     Past Surgical History:  Procedure Laterality Date  . ABDOMINAL HYSTERECTOMY    . carpel tunnel    . HERNIA REPAIR    . TUBAL LIGATION    . WISDOM TOOTH EXTRACTION      Family History  Problem Relation Age of Onset  . Heart disease Mother   . Heart disease Father   . Diabetes Daughter   . Diabetes Paternal Grandfather   . Heart disease Paternal Grandfather   . Macular degeneration Paternal Grandfather   . Glaucoma Maternal Grandmother   . Macular degeneration Sister   . Other Other     paternal 2nd cousin-tested + for gene for breast cancer   Social History:  reports that she has never smoked. She has never used smokeless tobacco. She reports that she does not drink alcohol or use drugs.  Allergies: No Known Allergies  Medications Prior to Admission  Medication Sig Dispense Refill  . esomeprazole (NEXIUM) 40 MG capsule Take 40 mg by mouth as needed.    . polyethylene glycol-electrolytes (TRILYTE) 420 g solution Take 4,000 mLs by mouth once. 4000 mL 0  . sulfamethoxazole-trimethoprim (BACTRIM,SEPTRA) 200-40 MG/5ML suspension Take by mouth 2 (two) times daily.    Marland Kitchen estradiol (ESTRACE) 1 MG tablet Take 1 tablet (1 mg total) by mouth daily. 30 tablet 6  . zolpidem (AMBIEN) 5 MG tablet Take 1 tablet (5 mg total) by mouth at bedtime as needed for sleep. 30 tablet 1    No results found for this or any previous visit (from the past 48 hour(s)). No results found.  ROS  Height 4\' 11"  (1.499 m),  weight 183 lb (83 kg). Physical Exam  Constitutional: She appears well-developed and well-nourished.  HENT:  Mouth/Throat: Oropharynx is clear and moist.  Eyes: Conjunctivae are normal. No scleral icterus.  Neck: No thyromegaly present.  Cardiovascular: Normal rate, regular rhythm and normal heart sounds.   No murmur heard. Respiratory: Effort normal and breath sounds normal.  GI: Soft. She exhibits no distension and no mass. There is no tenderness.  Musculoskeletal: She exhibits no edema.  Lymphadenopathy:    She has no cervical adenopathy.  Neurological: She is alert.  Skin: Skin is warm and dry.     Assessment/Plan Average risk screening colonoscopy.  Hildred Laser, MD 01/20/2016, 8:53 AM

## 2016-01-20 NOTE — Op Note (Signed)
[facility] Patient Name: Gwendolyn Sweeney Procedure Date: 01/20/2016 8:55 AM MRN: [id number] Date of Birth: 07-12-1960 Attending MD: Hildred Laser , MD CSN: [id number] Age: 55 Admit Type: Outpatient Procedure:                Colonoscopy Indications:              Screening for colorectal malignant neoplasm Providers:                Hildred Laser, MD, Rosina Lowenstein, RN, Purcell Nails.                            Tina Griffiths, Technician Referring MD:             Halford Chessman, MD , Derrek Monaco, NP Medicines:                Meperidine 50 mg IV, Midazolam 6 mg IV Complications:            No immediate complications. Estimated Blood Loss:     Estimated blood loss was minimal. Procedure:                Pre-Anesthesia Assessment:                           - Prior to the procedure, a History and Physical                            was performed, and patient medications and                            allergies were reviewed. The patient's tolerance of                            previous anesthesia was also reviewed. The risks                            and benefits of the procedure and the sedation                            options and risks were discussed with the patient.                            All questions were answered, and informed consent                            was obtained. Prior Anticoagulants: The patient has                            taken no previous anticoagulant or antiplatelet                            agents. ASA Grade Assessment: I - A normal, healthy                            patient. After reviewing the risks and benefits,  the patient was deemed in satisfactory condition to                            undergo the procedure.                           After obtaining informed consent, the colonoscope                            was passed under direct vision. Throughout the                            procedure, the patient's blood pressure,  pulse, and                            oxygen saturations were monitored continuously. The                            [id number]) was introduced through the                            anus and advanced to the the cecum, identified by                            appendiceal orifice and ileocecal valve. The                            colonoscopy was performed without difficulty. The                            patient tolerated the procedure well. The quality                            of the bowel preparation was good. The ileocecal                            valve, appendiceal orifice, and rectum were                            photographed. Scope In: 9:01:19 AM Scope Out: 9:20:33 AM Scope Withdrawal Time: 0 hours 10 minutes 53 seconds  Total Procedure Duration: 0 hours 19 minutes 14 seconds  Findings:      A 4 mm polyp was found in the mid sigmoid colon. The polyp was sessile.       The polyp was removed with a cold snare. Resection and retrieval were       complete.      A few small-mouthed diverticula were found in the sigmoid colon,       proximal sigmoid colon, mid sigmoid colon and distal sigmoid colon.      The retroflexed view of the distal rectum and anal verge was normal and       showed no anal or rectal abnormalities. Impression:               - One 4 mm polyp in the mid sigmoid colon, removed  with a cold snare. Resected and retrieved.                           - Diverticulosis in the sigmoid colon, in the                            proximal sigmoid colon, in the mid sigmoid colon                            and in the distal sigmoid colon.                           - The distal rectum and anal verge are normal on                            retroflexion view. Moderate Sedation:      Moderate (conscious) sedation was administered by the endoscopy nurse       and supervised by the endoscopist. The following parameters were       monitored:  oxygen saturation, heart rate, blood pressure, CO2       capnography and response to care. Total physician intraservice time was       26 minutes. Recommendation:           - Patient has a contact number available for                            emergencies. The signs and symptoms of potential                            delayed complications were discussed with the                            patient. Return to normal activities tomorrow.                            Written discharge instructions were provided to the                            patient.                           - Resume previous diet and high fiber diet today.                           - Continue present medications.                           - Await pathology results.                           - Repeat colonoscopy for surveillance based on                            pathology results. Procedure Code(s):        --- Professional ---  [phone], Colonoscopy, flexible; with removal of                            tumor(s), polyp(s), or other lesion(s) by snare                            technique                           99152, Moderate sedation services provided by the                            same physician or other qualified health care                            professional performing the diagnostic or                            therapeutic service that the sedation supports,                            requiring the presence of an independent trained                            observer to assist in the monitoring of the                            patient's level of consciousness and physiological                            status; initial 15 minutes of intraservice time,                            patient age 49 years or older                           [phone], Moderate sedation services; each additional                            15 minutes intraservice time Diagnosis Code(s):        --- Professional  ---                           Z12.11, Encounter for screening for malignant                            neoplasm of colon                           D12.5, Benign neoplasm of sigmoid colon                           K57.30, Diverticulosis of large intestine without                            perforation or  abscess without bleeding CPT copyright 2016 American Medical Association. All rights reserved. The codes documented in this report are preliminary and upon coder review may  be revised to meet current compliance requirements. Hildred Laser, MD Hildred Laser, MD 01/20/2016 9:28:58 AM This report has been signed electronically. Number of Addenda: 0

## 2016-01-22 ENCOUNTER — Encounter (HOSPITAL_COMMUNITY): Payer: Self-pay | Admitting: Internal Medicine

## 2016-03-25 ENCOUNTER — Other Ambulatory Visit: Payer: Self-pay | Admitting: Adult Health

## 2016-03-25 DIAGNOSIS — Z1231 Encounter for screening mammogram for malignant neoplasm of breast: Secondary | ICD-10-CM

## 2016-04-06 ENCOUNTER — Ambulatory Visit (HOSPITAL_COMMUNITY)
Admission: RE | Admit: 2016-04-06 | Discharge: 2016-04-06 | Disposition: A | Discharge status: Home / Self Care | Payer: PRIVATE HEALTH INSURANCE | Source: Ambulatory Visit | Attending: Adult Health | Admitting: Adult Health

## 2016-04-06 DIAGNOSIS — Z1231 Encounter for screening mammogram for malignant neoplasm of breast: Secondary | ICD-10-CM | POA: Diagnosis present

## 2016-07-18 ENCOUNTER — Encounter: Payer: Self-pay | Admitting: Adult Health

## 2016-07-18 ENCOUNTER — Ambulatory Visit (INDEPENDENT_AMBULATORY_CARE_PROVIDER_SITE_OTHER): Payer: PRIVATE HEALTH INSURANCE | Admitting: Adult Health

## 2016-07-18 VITALS — BP 136/80 | HR 76 | Ht 60.0 in | Wt 183.0 lb

## 2016-07-18 DIAGNOSIS — R319 Hematuria, unspecified: Secondary | ICD-10-CM

## 2016-07-18 DIAGNOSIS — N8189 Other female genital prolapse: Secondary | ICD-10-CM

## 2016-07-18 DIAGNOSIS — R1032 Left lower quadrant pain: Secondary | ICD-10-CM

## 2016-07-18 DIAGNOSIS — R102 Pelvic and perineal pain: Secondary | ICD-10-CM

## 2016-07-18 LAB — POCT URINALYSIS DIPSTICK
Glucose, UA: NEGATIVE
KETONES UA: NEGATIVE
Leukocytes, UA: NEGATIVE
Nitrite, UA: NEGATIVE
PROTEIN UA: NEGATIVE

## 2016-07-18 MED ORDER — SULFAMETHOXAZOLE-TRIMETHOPRIM 800-160 MG PO TABS: 1.0000 | ORAL_TABLET | Freq: Two times a day (BID) | ORAL | 0 refills | Status: DC

## 2016-07-18 MED ORDER — ZOLPIDEM TARTRATE 5 MG PO TABS: 5.0000 mg | ORAL_TABLET | Freq: Every evening | ORAL | 1 refills | Status: DC | PRN

## 2016-07-18 NOTE — Progress Notes (Signed)
Subjective:     Patient ID: Gwendolyn Sweeney, female   DOB: 03-05-1961, 56 y.o.   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 Has some UI at times. She is on thyroid meds now, but not sure how much, will send records. PCP Dr Gerarda Fraction.  Review of Systems +pelvic pain +UI at times Reviewed past medical,surgical, social and family history. Reviewed medications and allergies.     Objective:   Physical Exam BP 136/80 (BP Location: Left Arm, Patient Position: Sitting, Cuff Size: Normal)   Pulse 76   Ht 5' (1.524 m)   Wt 183 lb (83 kg)   BMI 35.74 kg/m    Urine Trace blood, PHQ 2 score 0. Skin warm and dry.Pelvic: external genitalia is normal in appearance no lesions, vagina: pale and relaxed with loss of moisture and rugae,urethra has no lesions or masses noted, cervix and uterus are absent, adnexa: no masses or tenderness noted. Bladder is non tender and no masses felt. No CVAT tenderness noted. Discussed trying pessary to see if helps.Can try Kegel's.   Assessment:     1. Pelvic pain   2. Hematuria, unspecified type   3. Pelvic floor relaxation       Plan:     Rx septra ds 1 bid x 7 days Refilled Ambien 5 mg #30 take 1 at hs prn with 1 refill at her request  Push fluids UA C&S sent Return in 2 weeks for pessary fitting with Dr Glo Herring Review Gwendolyn Sweeney handout on [place]

## 2016-07-19 LAB — URINALYSIS, ROUTINE W REFLEX MICROSCOPIC
BILIRUBIN UA: NEGATIVE
GLUCOSE, UA: NEGATIVE
KETONES UA: NEGATIVE
Leukocytes, UA: NEGATIVE
NITRITE UA: NEGATIVE
Protein, UA: NEGATIVE
RBC UA: NEGATIVE
SPEC GRAV UA: 1.029 (ref 1.005–1.030)
UUROB: 0.2 mg/dL (ref 0.2–1.0)
pH, UA: 5 (ref 5.0–7.5)

## 2016-07-20 LAB — URINE CULTURE

## 2016-07-25 ENCOUNTER — Telehealth: Payer: Self-pay | Admitting: Adult Health

## 2016-07-25 NOTE — Telephone Encounter (Signed)
Pt aware I got labs from Dr Gerarda Fraction and TSH was 4.740,will recheck at physical

## 2016-08-08 ENCOUNTER — Encounter: Payer: Self-pay | Admitting: Obstetrics and Gynecology

## 2016-08-08 ENCOUNTER — Ambulatory Visit (INDEPENDENT_AMBULATORY_CARE_PROVIDER_SITE_OTHER): Payer: PRIVATE HEALTH INSURANCE | Admitting: Obstetrics and Gynecology

## 2016-08-08 VITALS — BP 150/82 | HR 64 | Ht 59.5 in | Wt 183.6 lb

## 2016-08-08 DIAGNOSIS — N8189 Other female genital prolapse: Secondary | ICD-10-CM | POA: Diagnosis not present

## 2016-08-08 DIAGNOSIS — R102 Pelvic and perineal pain: Secondary | ICD-10-CM | POA: Diagnosis not present

## 2016-08-08 DIAGNOSIS — N816 Rectocele: Secondary | ICD-10-CM | POA: Diagnosis not present

## 2016-08-08 MED ORDER — ESTRADIOL 0.1 MG/GM VA CREA: 0.2500 | TOPICAL_CREAM | VAGINAL | 3 refills | Status: DC

## 2016-08-08 NOTE — Patient Instructions (Addendum)
About Rectocele  Overview  A rectocele is a type of hernia which causes different degrees of bulging of the rectal tissues into the vaginal wall.  You may even notice that it presses against the vaginal wall so much that some vaginal tissues droop outside of the opening of your vagina.  Causes of Rectocele  The most common cause is childbirth.  The muscles and ligaments in the pelvis that hold up and support the female organs and vagina become stretched and weakened during labor and delivery.  The more babies you have, the more the support tissues are stretched and weakened.  Not everyone who has a baby will develop a rectocele.  Some women have stronger supporting tissue in the pelvis and may not have as much of a problem as others.  Women who have a Cesarean section usually do not get rectocele's unless they pushed a long time prior to the cesarean delivery.  Other conditions that can cause a rectocele include chronic constipation, a chronic cough, a lot of heavy lifting, and obesity.  Older women may have this problem because the loss of female hormones causes the vaginal tissue to become weaker.  Symptoms  There may not be any symptoms.  If you do have symptoms, they may include:  Pelvic pressure in the rectal area  Protrusion of the lower part of the vagina through the opening of the vagina  Constipation and trapping of the stool, making it difficult to have a bowel movement.  In severe cases, you may have to press on the lower part of your vagina to help push the stool out of you rectum.  This is called splinting to empty.  Diagnosing Rectocele  Your health care provider will ask about your symptoms and perform a pelvic exam.  S/he will ask you to bear down, pushing like you are having a bowel movement so as to see how far the lower part of the vagina protrudes into the vagina and possible outside of the vagina.  Your provider will also ask you to contract the muscles of your pelvis  (like you are stopping the stream in the middle of urinating) to determine the strength of your pelvic muscles.  Your provider may also do a rectal exam.  Treatment Options  If you do not have any symptoms, no treatment may be necessary.  Other treatment options include:  Pelvic floor exercises: Contracting the muscles in your genital area may help strengthen your muscles and support the organs.  Be sure to get proper exercise instruction from you physical therapist.  A pessary (removealbe pelvic support device) sometimes helps rectocele symptoms.  Surgery: Surgical repair may be necessary. In some cases the uterus may need to be taken out ( a hysterectomy) as well.  There are many types of surgery for pelvic support problems.  Look for physicians who specialize in repair procedures.  You can take care of yourself by:  Treating and preventing constipation  Avoiding heavy lifting, and lifting correctly (with your legs, not with you waist or back)  Treating a chronic cough or bronchitis  Not smoking  avoiding too much weight gain  Doing pelvic floor exercises   2007, [employer] Doc.33Atrophic Vaginitis Atrophic vaginitis is when the tissues that line the vagina become dry and thin. This is caused by a drop in estrogen. Estrogen helps:  To keep the vagina moist.  To make a clear fluid that helps:  To lubricate the vagina for sex.  To protect the vagina  from infection. If the lining of the vagina is dry and thin, it may:  Make sex painful. It may also cause bleeding.  Cause a feeling of:  Burning.  Irritation.  Itchiness.  Make an exam of your vagina painful. It may also cause bleeding.  Make you lose interest in sex.  Cause a burning feeling when you pee.  Make your vaginal fluid (discharge) brown or yellow. For some women, there are no symptoms. This condition is most common in women who do not get their regular menstrual periods anymore  (menopause). This often starts when a woman is 71-17 years old. Follow these instructions at home:  Take medicines only as told by your doctor. Do not use any herbal or alternative medicines unless your doctor says it is okay.  Use over-the-counter products for dryness only as told by your doctor. These include:  Creams.  Lubricants.  Moisturizers.  Do not douche.  Do not use products that can make your vagina dry. These include:  Scented feminine sprays.  Scented tampons.  Scented soaps.  If it hurts to have sex, tell your sexual partner. Contact a doctor if:  Your discharge looks different than normal.  Your vagina has an unusual smell.  You have new symptoms.  Your symptoms do not get better with treatment.  Your symptoms get worse. This information is not intended to replace advice given to you by your health care provider. Make sure you discuss any questions you have with your health care provider. Document Released: 11/09/2007 Document Revised: 10/29/2015 Document Reviewed: 05/14/2014 Elsevier Interactive Patient Education  2017 [employer].

## 2016-08-08 NOTE — Progress Notes (Signed)
Patient ID: CHELLA GASTON, female   DOB: 13-Sep-1960, 56 y.o.   MRN: [id number]   [facility] Visit  @DATE @            Patient name: ADESOLA DUPREY MRN [id number]  Date of birth: [birth year]  CC & HPI:   Chief Complaint  Patient presents with  . pessary fitting    has questions about pessary     JENNILEE CULLOP is a 56 y.o. female s/p hysterectomy presenting today for complaints of ongoing, intermittent pelvic pain. Pt was seen by the office on 07/18/16 for the same issue by Derrek Monaco, NP, and was advised to return for pessary fitting.   Pt is not currently taking any hormone supplements. She states her vasomotor symptoms (vaginal dryness, hot sweats) do not significantly bother her.   She also states she does not have significant issues with bowel movements, noting that she is able to use the splinting technique if she has any issues. She states she does not have trouble with fullness or difficulty cleaning up after BMs.   ROS:  ROS +pelvic pain   Pertinent History Reviewed:   Reviewed: Significant for abdominal hysterectomy, tubal ligation  Medical         Past Medical History:  Diagnosis Date  . Fatigue 04/08/2013  . Pelvic floor relaxation 03/23/2015  . Thyroid disease   . Ulcer ([place])   . Urinary tract bacterial infections                               Surgical Hx:    Past Surgical History:  Procedure Laterality Date  . ABDOMINAL HYSTERECTOMY    . carpel tunnel    . COLONOSCOPY N/A 01/20/2016   Procedure: COLONOSCOPY;  Surgeon: Rogene Houston, MD;  Location: [facility];  Service: Endoscopy;  Laterality: N/A;  830  . HERNIA REPAIR    . TUBAL LIGATION    . WISDOM TOOTH EXTRACTION     Medications: Reviewed & Updated - see associated section                       Current Outpatient Prescriptions:  .  celecoxib (CELEBREX) 50 MG capsule, Take 50 mg by mouth 2 (two) times daily., Disp: , Rfl:  .  levothyroxine (SYNTHROID, LEVOTHROID) 200 MCG tablet,  Take 200 mcg by mouth daily before breakfast., Disp: , Rfl:  .  Omeprazole (PRILOSEC PO), Take by mouth as needed., Disp: , Rfl:  .  zolpidem (AMBIEN) 5 MG tablet, Take 1 tablet (5 mg total) by mouth at bedtime as needed for sleep., Disp: 30 tablet, Rfl: 1 .  sulfamethoxazole-trimethoprim (BACTRIM DS,SEPTRA DS) 800-160 MG tablet, Take 1 tablet by mouth 2 (two) times daily. (Patient not taking: Reported on 08/08/2016), Disp: 14 tablet, Rfl: 0   Social History: Reviewed -  reports that she has never smoked. She has never used smokeless tobacco.  Objective Findings:  Vitals: Blood pressure (!) 150/82, pulse 64, height 4' 11.5" (1.511 m), weight 183 lb 9.6 oz (83.3 kg).  Physical Examination: General appearance - alert, well appearing, and in no distress Mental status - alert, oriented to person, place, and time Abdomen - soft, nontender, nondistended, no masses or organomegaly Pelvic -  VULVA: normal appearing vulva with no masses, tenderness or lesions,  VAGINA: normal appearing vagina with normal color and discharge, no lesions, vaginal cuff well supported, anterior  wall well supported s/p bladder tack  PELVIC FLOOR EXAM: good bladder support.  back wall shows a rectocele to >90 degrees. No enterocele noted.   Discussion: 1. Discussed with pt current information, studies and literature available on risks and benefits of hormone therapy, specifically in regards to vaginal estrogen.   At end of discussion, pt had opportunity to ask questions and has no further questions at this time.   Specific discussion of hormone therapy as noted above. Greater than 50% was spent in counseling and coordination of care with the patient.   Total time greater than: 25 minutes.     Assessment & Plan:   A:  1. Atrophic vaginitis  2. Rectocele , stable, not desiring tx.  P:  1. Rx Premarin VC hs 3x/wk 2. F/u PRN    By signing my name below, I, Hansel Feinstein, attest that this documentation has been  prepared under the direction and in the presence of Jonnie Kind, MD. Electronically Signed: Hansel Feinstein, ED Scribe. 08/08/16. 9:03 AM.  I personally performed the services described in this documentation, which was SCRIBED in my presence. The recorded information has been reviewed and considered accurate. It has been edited as necessary during review. Jonnie Kind, MD

## 2016-10-04 ENCOUNTER — Telehealth: Payer: Self-pay | Admitting: Adult Health

## 2016-10-04 DIAGNOSIS — Z87442 Personal history of urinary calculi: Secondary | ICD-10-CM

## 2016-10-04 MED ORDER — SULFAMETHOXAZOLE-TRIMETHOPRIM 800-160 MG PO TABS: 1.0000 | ORAL_TABLET | Freq: Two times a day (BID) | ORAL | 0 refills | Status: DC

## 2016-10-04 NOTE — Telephone Encounter (Signed)
Pt called stating that she has a UTI and would like to come in and drop off a urine sample for [facility].

## 2016-10-04 NOTE — Telephone Encounter (Signed)
She thinks may have kidney stone again and UTI, will rx septra ds and get renal US.Push water.Renal US scheduled 5/7 at 12:30 pm pt aware

## 2016-10-10 ENCOUNTER — Ambulatory Visit (HOSPITAL_COMMUNITY)
Admission: RE | Admit: 2016-10-10 | Discharge: 2016-10-10 | Disposition: A | Discharge status: Home / Self Care | Payer: PRIVATE HEALTH INSURANCE | Source: Ambulatory Visit | Attending: Adult Health | Admitting: Adult Health

## 2016-10-10 DIAGNOSIS — Z87442 Personal history of urinary calculi: Secondary | ICD-10-CM | POA: Insufficient documentation

## 2016-10-11 ENCOUNTER — Telehealth: Payer: Self-pay | Admitting: Adult Health

## 2016-10-11 ENCOUNTER — Encounter: Payer: Self-pay | Admitting: Adult Health

## 2016-10-11 DIAGNOSIS — K760 Fatty (change of) liver, not elsewhere classified: Secondary | ICD-10-CM

## 2016-10-11 HISTORY — DX: Fatty (change of) liver, not elsewhere classified: K76.0

## 2016-10-11 NOTE — Telephone Encounter (Signed)
Pt aware renal US showed normal kidneys but fatty liver, will refer to Dr Laural Golden for evaluation, he did her colonoscopy.

## 2016-11-14 ENCOUNTER — Ambulatory Visit (INDEPENDENT_AMBULATORY_CARE_PROVIDER_SITE_OTHER): Payer: PRIVATE HEALTH INSURANCE | Admitting: Internal Medicine

## 2016-11-18 ENCOUNTER — Telehealth: Payer: Self-pay | Admitting: Adult Health

## 2016-11-18 MED ORDER — AZITHROMYCIN 250 MG PO TABS: ORAL_TABLET | ORAL | 0 refills | Status: DC

## 2016-11-18 NOTE — Telephone Encounter (Signed)
Patient states she has been working with hay but starting last night the right side of face, right ear pain, sore throat on right side no relief with Claritin. Doesn't want it to get worse. Please advise.

## 2016-11-18 NOTE — Telephone Encounter (Signed)
Right ear itchy and throat sore, is taking claritin, will rx Zpack, if not better make appt

## 2016-11-18 NOTE — Telephone Encounter (Signed)
Pt called stating that she would like a call back from [name], [place] states that it is not for what it normal is. Pt states she does not have a UTI it is something else.

## 2017-02-03 ENCOUNTER — Other Ambulatory Visit: Payer: PRIVATE HEALTH INSURANCE

## 2017-02-03 DIAGNOSIS — Z87440 Personal history of urinary (tract) infections: Secondary | ICD-10-CM

## 2017-02-04 LAB — URINALYSIS, ROUTINE W REFLEX MICROSCOPIC
Bilirubin, UA: NEGATIVE
GLUCOSE, UA: NEGATIVE
Ketones, UA: NEGATIVE
Leukocytes, UA: NEGATIVE
NITRITE UA: NEGATIVE
PH UA: 5 (ref 5.0–7.5)
Protein, UA: NEGATIVE
RBC, UA: NEGATIVE
Specific Gravity, UA: 1.026 (ref 1.005–1.030)
UUROB: 0.2 mg/dL (ref 0.2–1.0)

## 2017-02-05 LAB — URINE CULTURE

## 2017-02-07 ENCOUNTER — Telehealth: Payer: Self-pay | Admitting: Adult Health

## 2017-02-07 MED ORDER — AMPICILLIN 500 MG PO CAPS: 500.0000 mg | ORAL_CAPSULE | Freq: Four times a day (QID) | ORAL | 0 refills | Status: DC

## 2017-02-07 NOTE — Telephone Encounter (Signed)
Pt aware urine culture +, will rx ampicillin

## 2017-03-22 ENCOUNTER — Telehealth: Payer: Self-pay | Admitting: Adult Health

## 2017-03-22 NOTE — Telephone Encounter (Signed)
?   Muscle vs UTI has appt with PCP in am, but will make appt for physical

## 2017-03-22 NOTE — Telephone Encounter (Signed)
Patient called stating that she would like a call back from [place] regarding her probably having a UTI. Please contact pt

## 2017-03-30 ENCOUNTER — Other Ambulatory Visit (HOSPITAL_COMMUNITY): Payer: Self-pay | Admitting: Internal Medicine

## 2017-03-30 DIAGNOSIS — Z1231 Encounter for screening mammogram for malignant neoplasm of breast: Secondary | ICD-10-CM

## 2017-04-07 ENCOUNTER — Encounter (HOSPITAL_COMMUNITY): Payer: Self-pay

## 2017-04-07 ENCOUNTER — Ambulatory Visit (HOSPITAL_COMMUNITY)
Admission: RE | Admit: 2017-04-07 | Discharge: 2017-04-07 | Disposition: A | Discharge status: Home / Self Care | Payer: PRIVATE HEALTH INSURANCE | Source: Ambulatory Visit | Attending: Internal Medicine | Admitting: Internal Medicine

## 2017-04-07 ENCOUNTER — Other Ambulatory Visit: Payer: Self-pay | Admitting: Internal Medicine

## 2017-04-07 DIAGNOSIS — Z1231 Encounter for screening mammogram for malignant neoplasm of breast: Secondary | ICD-10-CM

## 2017-07-31 ENCOUNTER — Other Ambulatory Visit: Payer: PRIVATE HEALTH INSURANCE | Admitting: Adult Health

## 2017-07-31 ENCOUNTER — Encounter: Payer: Self-pay | Admitting: *Deleted

## 2017-08-24 ENCOUNTER — Other Ambulatory Visit: Payer: PRIVATE HEALTH INSURANCE

## 2017-08-24 DIAGNOSIS — N390 Urinary tract infection, site not specified: Secondary | ICD-10-CM

## 2017-08-24 NOTE — Addendum Note (Signed)
Addended by: Diona Fanti A on: 08/24/2017 09:43 AM   Modules accepted: Orders

## 2017-08-26 LAB — URINE CULTURE

## 2017-08-28 ENCOUNTER — Telehealth: Payer: Self-pay | Admitting: *Deleted

## 2017-08-28 ENCOUNTER — Other Ambulatory Visit: Payer: Self-pay | Admitting: Women's Health

## 2017-08-28 MED ORDER — AMOXICILLIN 500 MG PO CAPS: 500.0000 mg | ORAL_CAPSULE | Freq: Two times a day (BID) | ORAL | 0 refills | Status: DC

## 2017-08-28 NOTE — Telephone Encounter (Signed)
Unable to leave VM

## 2017-08-28 NOTE — Progress Notes (Unsigned)
Pt left urine specimen, thinking she has UTI. Urine cx returned + GBS bacteruria. Rx amoxicillin BID x 7d.  Roma Schanz, CNM, WHNP-BC 08/28/2017 1:00 PM

## 2017-08-28 NOTE — Telephone Encounter (Signed)
LMOVM at first number, second number no VM

## 2017-08-29 ENCOUNTER — Telehealth: Payer: Self-pay | Admitting: Adult Health

## 2017-08-29 NOTE — Telephone Encounter (Signed)
Spoke with pt letting her know urine culture showed infection and antibiotic was sent to pharmacy yesterday. Pt voiced understanding. [place]

## 2017-08-29 NOTE — Telephone Encounter (Signed)
Patient called stating that she would lie to know the results of the urine she dropped off the other day. I let patient know Anderson Malta is not in the office and wont be in until 4/01. Pt states that she needs someone anyone to read the report and call her something in because by next week she'll be in bed hurting really bad. Please contact pt

## 2017-08-30 ENCOUNTER — Telehealth: Payer: Self-pay | Admitting: *Deleted

## 2017-08-30 NOTE — Telephone Encounter (Signed)
Pt notified by Marcie Bal.

## 2017-10-16 ENCOUNTER — Telehealth: Payer: Self-pay | Admitting: Adult Health

## 2017-10-16 MED ORDER — ZOLPIDEM TARTRATE 5 MG PO TABS
5.0000 mg | ORAL_TABLET | Freq: Every evening | ORAL | 1 refills | Status: DC | PRN
Start: 2017-10-16 — End: 2020-08-10

## 2017-10-16 NOTE — Telephone Encounter (Signed)
Wants refill on ambien, will do

## 2017-11-09 ENCOUNTER — Telehealth: Payer: Self-pay | Admitting: *Deleted

## 2017-11-09 ENCOUNTER — Encounter: Payer: Self-pay | Admitting: *Deleted

## 2017-12-27 ENCOUNTER — Telehealth: Payer: Self-pay | Admitting: Adult Health

## 2017-12-27 NOTE — Telephone Encounter (Signed)
Patient called stating that she would like to speak with Anderson Malta, patient did not state the reason why. Please contact pt

## 2017-12-28 NOTE — Telephone Encounter (Signed)
Attempted to call pt for 3rd time. Voicemail not set up.

## 2018-01-11 ENCOUNTER — Other Ambulatory Visit: Payer: Self-pay

## 2018-01-11 DIAGNOSIS — Z87440 Personal history of urinary (tract) infections: Secondary | ICD-10-CM

## 2018-01-11 NOTE — Progress Notes (Signed)
Pt left urine sample at office. Pt states she is having pain in her legs and that's how her infections start. I advised pt I would send to lab for culture. She wanted it dipped here first. Urine was dipped and was negative. I advised I would send to lab for culture. Pt voiced understanding. [place]

## 2018-01-12 LAB — URINALYSIS, ROUTINE W REFLEX MICROSCOPIC
Bilirubin, UA: NEGATIVE
Glucose, UA: NEGATIVE
Ketones, UA: NEGATIVE
Leukocytes, UA: NEGATIVE
NITRITE UA: NEGATIVE
Protein, UA: NEGATIVE
RBC, UA: NEGATIVE
Specific Gravity, UA: 1.027 (ref 1.005–1.030)
UUROB: 0.2 mg/dL (ref 0.2–1.0)
pH, UA: 5.5 (ref 5.0–7.5)

## 2018-01-13 LAB — URINE CULTURE

## 2018-01-15 ENCOUNTER — Telehealth: Payer: Self-pay | Admitting: Adult Health

## 2018-01-15 MED ORDER — AMPICILLIN 500 MG PO CAPS: 500.0000 mg | ORAL_CAPSULE | Freq: Four times a day (QID) | ORAL | 0 refills | Status: DC

## 2018-01-15 NOTE — Telephone Encounter (Signed)
Pt aware urine had strept B, will rx ampicillin

## 2018-04-16 ENCOUNTER — Telehealth: Payer: Self-pay | Admitting: *Deleted

## 2018-04-16 NOTE — Telephone Encounter (Signed)
Attempted to return pts call. Voicemail not set up.  

## 2018-04-16 NOTE — Telephone Encounter (Signed)
Pt called stating that she feels like she has a UTI. She c/o burning with urination and some blood. Advised pt to come by office and leave a urine sample for [place]. Pt states that she will come by tomorrow morning.

## 2018-04-17 ENCOUNTER — Telehealth: Payer: Self-pay | Admitting: Obstetrics & Gynecology

## 2018-04-17 ENCOUNTER — Other Ambulatory Visit: Payer: PRIVATE HEALTH INSURANCE

## 2018-04-17 ENCOUNTER — Encounter (INDEPENDENT_AMBULATORY_CARE_PROVIDER_SITE_OTHER): Payer: Self-pay

## 2018-04-17 DIAGNOSIS — R300 Dysuria: Secondary | ICD-10-CM

## 2018-04-17 NOTE — Telephone Encounter (Signed)
Patient left a urine sample this morning.  She left her number & pharmacy information if needed.  [facility]  [phone]

## 2018-04-17 NOTE — Telephone Encounter (Signed)
Returned patient's call. Not at work and unable to leave message on VM.  Please inform patient if she calls, urine dip was negative but was sent for culture. Will call if results come back abnormal.

## 2018-04-19 ENCOUNTER — Other Ambulatory Visit: Payer: Self-pay | Admitting: Women's Health

## 2018-04-19 ENCOUNTER — Telehealth: Payer: Self-pay | Admitting: *Deleted

## 2018-04-19 LAB — URINE CULTURE

## 2018-04-19 MED ORDER — AMPICILLIN 500 MG PO CAPS: 500.0000 mg | ORAL_CAPSULE | Freq: Four times a day (QID) | ORAL | 0 refills | Status: DC

## 2018-04-19 NOTE — Telephone Encounter (Signed)
Unable to leave VM. If she calls back, please inform urine culture positive antibiotic was sent to pharmacy and to take all as directed.

## 2018-04-19 NOTE — Telephone Encounter (Signed)
Patient informed urine culture positive and antibiotic sent to pharmacy. Advised to take all as directed. Verbalized understanding.

## 2018-06-01 ENCOUNTER — Other Ambulatory Visit (HOSPITAL_COMMUNITY): Payer: Self-pay | Admitting: Adult Health

## 2018-06-01 DIAGNOSIS — Z1231 Encounter for screening mammogram for malignant neoplasm of breast: Secondary | ICD-10-CM

## 2018-06-04 ENCOUNTER — Ambulatory Visit (HOSPITAL_COMMUNITY)
Admission: RE | Admit: 2018-06-04 | Discharge: 2018-06-04 | Disposition: A | Discharge status: Home / Self Care | Payer: PRIVATE HEALTH INSURANCE | Source: Ambulatory Visit | Attending: Adult Health | Admitting: Adult Health

## 2018-06-04 ENCOUNTER — Encounter (HOSPITAL_COMMUNITY): Payer: Self-pay

## 2018-06-04 DIAGNOSIS — Z1231 Encounter for screening mammogram for malignant neoplasm of breast: Secondary | ICD-10-CM | POA: Insufficient documentation

## 2018-08-06 NOTE — Telephone Encounter (Signed)
Note sent to nurse. 

## 2018-08-23 ENCOUNTER — Telehealth: Payer: Self-pay | Admitting: Adult Health

## 2018-08-23 MED ORDER — AMPICILLIN 500 MG PO CAPS: 500.0000 mg | ORAL_CAPSULE | Freq: Four times a day (QID) | ORAL | 0 refills | Status: DC

## 2018-08-23 NOTE — Addendum Note (Signed)
Addended by: Derrek Monaco A on: 08/23/2018 01:59 PM   Modules accepted: Orders

## 2018-08-23 NOTE — Telephone Encounter (Signed)
Gwendolyn Sweeney has UTI symptoms of low back pain and urgency will rx ampicillin and push fluids.Let me know if not better

## 2018-08-23 NOTE — Telephone Encounter (Signed)
Pt wanted to make sure we had the correct # to call. She thinks she gave a wrong # when she called the first time

## 2018-08-23 NOTE — Telephone Encounter (Signed)
Patient called stating that she is having another symptoms of a UTI. Pt states should she come to leave Urine or can she just get something called in. Please contact pt

## 2018-08-28 ENCOUNTER — Telehealth: Payer: Self-pay | Admitting: Adult Health

## 2018-08-28 NOTE — Telephone Encounter (Signed)
Having ?drug rash from ampicillin, so stop it and take an antihistamine and let me know if better tomorrow

## 2018-08-28 NOTE — Telephone Encounter (Signed)
Patient called, she'd like Gwendolyn Sweeney to give her a call, no details.  [phone]

## 2018-08-29 ENCOUNTER — Telehealth: Payer: Self-pay | Admitting: Adult Health

## 2018-08-29 NOTE — Telephone Encounter (Signed)
Patient stated she was supposed to call you this morning, tried to transfer no answer.  Please call the patient when you have time.  She stated her rash isn't any better, she stayed out of work today, can't wear a bra because it's so bad.  [phone]

## 2018-08-29 NOTE — Telephone Encounter (Signed)
Rash still itches some, can't wear bra it irritates, feels hot, continue to not take ampicillin and use benadryl and topical steroid cream .Has no Shortness of breath, if continues past 48 hours may need steroid dose pak.

## 2018-08-30 ENCOUNTER — Telehealth: Payer: Self-pay | Admitting: *Deleted

## 2018-08-30 NOTE — Telephone Encounter (Signed)
Rash on back now, still itches some but taking benadryl.inhaled corticosteroids working today, so bra makes it itch

## 2018-08-30 NOTE — Telephone Encounter (Signed)
Patient states she was told to call and talk to [facility] about her rash patient states the rash is now in different places. Please advise [phone]

## 2018-12-14 ENCOUNTER — Telehealth: Payer: Self-pay | Admitting: Adult Health

## 2018-12-14 MED ORDER — NITROFURANTOIN MONOHYD MACRO 100 MG PO CAPS: 100.0000 mg | ORAL_CAPSULE | Freq: Two times a day (BID) | ORAL | 0 refills | Status: DC

## 2018-12-14 NOTE — Telephone Encounter (Signed)
Please call pt urine has bad odor and legs are hurting can you call something in or does she need to come and give sample

## 2018-12-14 NOTE — Telephone Encounter (Signed)
Urine has odor, legs achy will rx macrobid an push fluids

## 2018-12-14 NOTE — Addendum Note (Signed)
Addended by: Derrek Monaco A on: 12/14/2018 10:26 AM   Modules accepted: Orders

## 2019-01-01 ENCOUNTER — Telehealth: Payer: Self-pay | Admitting: Adult Health

## 2019-01-01 MED ORDER — AZITHROMYCIN 250 MG PO TABS: ORAL_TABLET | ORAL | 0 refills | Status: DC

## 2019-01-01 MED ORDER — CETIRIZINE HCL 10 MG PO TABS: 10.0000 mg | ORAL_TABLET | Freq: Every day | ORAL | Status: DC

## 2019-01-01 NOTE — Addendum Note (Signed)
Addended by: Derrek Monaco A on: 01/01/2019 05:00 PM   Modules accepted: Orders

## 2019-01-01 NOTE — Telephone Encounter (Signed)
Pt would like to speak with Anderson Malta. States they she is having sinus issues and that her eyes were matted shut this morning.

## 2019-01-01 NOTE — Telephone Encounter (Signed)
Gwendolyn Sweeney slept under ceiling fan and head was stopped up and her left eye was matted and feels congested, will Rx Z pack and use zyrtec, leaving for beach

## 2019-03-15 ENCOUNTER — Other Ambulatory Visit: Payer: Self-pay | Admitting: *Deleted

## 2019-03-15 DIAGNOSIS — Z20822 Contact with and (suspected) exposure to covid-19: Secondary | ICD-10-CM

## 2019-03-17 LAB — NOVEL CORONAVIRUS, NAA: SARS-CoV-2, NAA: DETECTED — AB

## 2019-03-27 ENCOUNTER — Other Ambulatory Visit: Payer: Self-pay | Admitting: *Deleted

## 2019-03-27 DIAGNOSIS — Z20822 Contact with and (suspected) exposure to covid-19: Secondary | ICD-10-CM

## 2019-03-29 ENCOUNTER — Other Ambulatory Visit: Payer: Self-pay

## 2019-03-29 DIAGNOSIS — Z20822 Contact with and (suspected) exposure to covid-19: Secondary | ICD-10-CM

## 2019-03-31 ENCOUNTER — Telehealth: Payer: Self-pay

## 2019-03-31 NOTE — Telephone Encounter (Signed)
Pt called to check covid results advised results are not back yet.

## 2019-04-01 ENCOUNTER — Telehealth: Payer: Self-pay | Admitting: Internal Medicine

## 2019-04-01 LAB — NOVEL CORONAVIRUS, NAA: SARS-CoV-2, NAA: NOT DETECTED

## 2019-04-01 NOTE — Telephone Encounter (Signed)
Negative COVID results given. Patient results "NOT Detected." Caller expressed understanding. ° °

## 2019-10-17 ENCOUNTER — Telehealth: Payer: Self-pay | Admitting: Adult Health

## 2019-10-17 MED ORDER — SULFAMETHOXAZOLE-TRIMETHOPRIM 800-160 MG PO TABS: 1.0000 | ORAL_TABLET | Freq: Two times a day (BID) | ORAL | 0 refills | Status: DC

## 2019-10-17 NOTE — Telephone Encounter (Signed)
Will rx septra ds  

## 2019-10-17 NOTE — Telephone Encounter (Signed)
Having uti symptoms would like to speak to a nurse or Anderson Malta. Please try to call before 3:00 . Wants something called it to [facility] in [place]

## 2019-10-17 NOTE — Addendum Note (Signed)
Addended by: Derrek Monaco A on: 10/17/2019 03:22 PM   Modules accepted: Orders

## 2019-10-17 NOTE — Telephone Encounter (Addendum)
Pt has burning with urination, legs feel like jello, low back pain, feels tired. No fever. Pt would like something called in for UTI. Thanks!! [place]

## 2019-11-07 ENCOUNTER — Encounter: Payer: Self-pay | Admitting: *Deleted

## 2019-11-07 ENCOUNTER — Other Ambulatory Visit (INDEPENDENT_AMBULATORY_CARE_PROVIDER_SITE_OTHER): Payer: PRIVATE HEALTH INSURANCE | Admitting: *Deleted

## 2019-11-07 VITALS — BP 158/85 | HR 70 | Ht 59.0 in | Wt 184.0 lb

## 2019-11-07 DIAGNOSIS — R350 Frequency of micturition: Secondary | ICD-10-CM | POA: Diagnosis not present

## 2019-11-07 LAB — POCT URINALYSIS DIPSTICK
Blood, UA: NEGATIVE
Glucose, UA: NEGATIVE
Ketones, UA: NEGATIVE
Leukocytes, UA: NEGATIVE
Nitrite, UA: NEGATIVE
Protein, UA: NEGATIVE

## 2019-11-07 NOTE — Progress Notes (Signed)
   NURSE VISIT- UTI SYMPTOMS   SUBJECTIVE:  Gwendolyn Sweeney is a 59 y.o. [phone] female here for UTI symptoms. She is a GYN patient. She reports urinary frequency and burning with urination. .  OBJECTIVE:  BP (!) 158/85 (BP Location: Right Arm, Patient Position: Sitting, Cuff Size: Normal)   Pulse 70   Ht 4\' 11"  (1.499 m)   Wt 184 lb (83.5 kg)   BMI 37.16 kg/m   Appears well, in no apparent distress  No results found for this or any previous visit (from the past 24 hour(s)).  ASSESSMENT: GYN patient with UTI symptoms and negative nitrites  PLAN: Note routed to Derrek Monaco, AGNP   Rx sent by provider today: No Urine culture sent Call or return to clinic prn if these symptoms worsen or fail to improve as anticipated. Follow-up: as needed   Rolena Infante  11/07/2019 2:04 PM

## 2019-11-07 NOTE — Progress Notes (Signed)
Chart reviewed for nurse visit. Agree with plan of care.  Estill Dooms, NP 11/07/2019 4:48 PM

## 2019-11-08 LAB — URINALYSIS, ROUTINE W REFLEX MICROSCOPIC
Bilirubin, UA: NEGATIVE
Glucose, UA: NEGATIVE
Ketones, UA: NEGATIVE
Leukocytes,UA: NEGATIVE
Nitrite, UA: NEGATIVE
Protein,UA: NEGATIVE
RBC, UA: NEGATIVE
Specific Gravity, UA: 1.024 (ref 1.005–1.030)
Urobilinogen, Ur: 0.2 mg/dL (ref 0.2–1.0)
pH, UA: 5.5 (ref 5.0–7.5)

## 2019-11-09 LAB — URINE CULTURE

## 2019-11-11 ENCOUNTER — Telehealth: Payer: Self-pay | Admitting: Adult Health

## 2019-11-11 MED ORDER — DOXYCYCLINE HYCLATE 100 MG PO TABS: 100.0000 mg | ORAL_TABLET | Freq: Two times a day (BID) | ORAL | 0 refills | Status: DC

## 2019-11-11 NOTE — Telephone Encounter (Signed)
Pt aware that urine +GBS , will rx doxycycline, avoid the sun

## 2020-02-06 ENCOUNTER — Telehealth: Payer: Self-pay | Admitting: Adult Health

## 2020-02-06 MED ORDER — DOXYCYCLINE HYCLATE 100 MG PO TABS: 100.0000 mg | ORAL_TABLET | Freq: Two times a day (BID) | ORAL | 1 refills | Status: DC

## 2020-02-06 NOTE — Addendum Note (Signed)
Addended by: Derrek Monaco A on: 02/06/2020 09:39 AM   Modules accepted: Orders

## 2020-02-06 NOTE — Telephone Encounter (Signed)
Pt said she has a uti, SHe wants Anderson Malta to call her she said sometimes Danise Mina will just call something in for her

## 2020-02-06 NOTE — Telephone Encounter (Signed)
Has low back like when has UTI is at work and requests antibiotic, sent doxycycline to [facility]

## 2020-06-16 ENCOUNTER — Telehealth: Payer: Self-pay | Admitting: Adult Health

## 2020-06-16 NOTE — Telephone Encounter (Signed)
Pt states she's fatigued, with leg & hip pain, feels heavy - possibly getting a UTI States we usually call her something in  Please advise & call pt   [facility]

## 2020-06-16 NOTE — Telephone Encounter (Signed)
Pt has UTI symptoms to come at 4 pm tomorrow for nurse visit

## 2020-06-17 ENCOUNTER — Other Ambulatory Visit: Payer: PRIVATE HEALTH INSURANCE

## 2020-08-10 ENCOUNTER — Other Ambulatory Visit: Payer: Self-pay

## 2020-08-10 ENCOUNTER — Encounter: Payer: Self-pay | Admitting: Adult Health

## 2020-08-10 ENCOUNTER — Ambulatory Visit (INDEPENDENT_AMBULATORY_CARE_PROVIDER_SITE_OTHER): Payer: PRIVATE HEALTH INSURANCE | Admitting: Adult Health

## 2020-08-10 VITALS — BP 149/84 | HR 66 | Ht 59.25 in | Wt 179.0 lb

## 2020-08-10 DIAGNOSIS — Z1211 Encounter for screening for malignant neoplasm of colon: Secondary | ICD-10-CM

## 2020-08-10 DIAGNOSIS — Z01419 Encounter for gynecological examination (general) (routine) without abnormal findings: Secondary | ICD-10-CM | POA: Diagnosis not present

## 2020-08-10 DIAGNOSIS — Z1231 Encounter for screening mammogram for malignant neoplasm of breast: Secondary | ICD-10-CM

## 2020-08-10 DIAGNOSIS — N816 Rectocele: Secondary | ICD-10-CM

## 2020-08-10 DIAGNOSIS — R319 Hematuria, unspecified: Secondary | ICD-10-CM

## 2020-08-10 DIAGNOSIS — R030 Elevated blood-pressure reading, without diagnosis of hypertension: Secondary | ICD-10-CM

## 2020-08-10 DIAGNOSIS — Z90710 Acquired absence of both cervix and uterus: Secondary | ICD-10-CM | POA: Insufficient documentation

## 2020-08-10 DIAGNOSIS — R300 Dysuria: Secondary | ICD-10-CM | POA: Diagnosis not present

## 2020-08-10 LAB — POCT URINALYSIS DIPSTICK OB
Glucose, UA: NEGATIVE
Leukocytes, UA: NEGATIVE
Nitrite, UA: NEGATIVE

## 2020-08-10 LAB — HEMOCCULT GUIAC POC 1CARD (OFFICE): Fecal Occult Blood, POC: NEGATIVE

## 2020-08-10 MED ORDER — DOXYCYCLINE HYCLATE 100 MG PO TABS: 100.0000 mg | ORAL_TABLET | Freq: Two times a day (BID) | ORAL | 0 refills | Status: DC

## 2020-08-10 NOTE — Progress Notes (Signed)
Patient ID: Gwendolyn Sweeney, female   DOB: November 24, 1960, 60 y.o.   MRN: [id number] History of Present Illness:  Gwendolyn Sweeney is a 60 year old white female,married, sp hysterectomy, in for pelvic exam, she had physical with PCP. She is still working at [employer], but no longer raising tobacco.  PCP is Dr Gerarda Fraction.  Current Medications, Allergies, Past Medical History, Past Surgical History, Family History and Social History were reviewed in [facility] record.     Review of Systems: Patient denies any headaches, hearing loss, fatigue, blurred vision, shortness of breath, chest pain, abdominal pain, problems with bowel movements,  or intercourse. No joint pain or mood swings. Has some burning with urination, and top of legs feel tired Has head cold she says, has mask on   Physical Exam:BP (!) 149/84 (BP Location: Left Arm, Patient Position: Sitting, Cuff Size: Normal)   Pulse 66   Ht 4' 11.25" (1.505 m)   Wt 179 lb (81.2 kg)   BMI 35.85 kg/m urine dipstick +blood and protein and ketones. General:  Well developed, well nourished, no acute distress Skin:  Warm and dry Neck:  Midline trachea, normal thyroid, good ROM, no lymphadenopathy,no carotid bruits heard Lungs; Clear to auscultation bilaterally Cardiovascular: Regular rate and rhythm Pelvic:  External genitalia is normal in appearance, no lesions.  The vagina is pale with loss of moisture and rugae. Urethra has no lesions or masses. The cervix and uterus are absent.  No adnexal masses or tenderness noted.Bladder is non tender, no masses felt. Rectal: Good sphincter tone, no polyps, or hemorrhoids felt.  Hemoccult negative.+rectocele Extremities/musculoskeletal:  No swelling or varicosities noted, no clubbing or cyanosis Psych:  No mood changes, alert and cooperative,seems happy AA is 0 Fall risk is low PHQ 9 score is 1 GAD 7 score is 1  [facility] - 08/10/20 1007      Pregnancy Intention Screening   Does the  patient want to become pregnant in the next year? N/A    Does the patient's partner want to become pregnant in the next year? N/A    Would the patient like to discuss contraceptive options today? N/A      Contraception Wrap Up   Current Method Female Sterilization   hyst   End Method Female Sterilization   hyst   Contraception Counseling Provided No         Examination chaperoned by Celene Squibb LPN  Impression and Plan: 1. Visit for pelvic exam Physical with PCP Labs with PCP  Pelvic yearly   2. Encounter for screening fecal occult blood testing Colonoscopy per GI  3. Dysuria UA C&S sent Will rx doxycycline Meds ordered this encounter  Medications  . doxycycline (VIBRA-TABS) 100 MG tablet    Sig: Take 1 tablet (100 mg total) by mouth 2 (two) times daily.    Dispense:  20 tablet    Refill:  0    Order Specific Question:   Supervising Provider    Answer:   Elonda Husky, LUTHER H [[id number]]    4. S/P hysterectomy  5. Rectocele   6. Hematuria, unspecified type UA C&S sent  7. Screening mammogram for breast cancer Pt to call for appt  8. Elevated BP without diagnosis of hypertension Decrease salt and follow up with PCP

## 2020-08-11 LAB — URINALYSIS
Bilirubin, UA: NEGATIVE
Glucose, UA: NEGATIVE
Ketones, UA: NEGATIVE
Leukocytes,UA: NEGATIVE
Nitrite, UA: NEGATIVE
RBC, UA: NEGATIVE
Specific Gravity, UA: 1.027 (ref 1.005–1.030)
Urobilinogen, Ur: 0.2 mg/dL (ref 0.2–1.0)
pH, UA: 5.5 (ref 5.0–7.5)

## 2020-08-13 LAB — URINE CULTURE

## 2020-09-02 ENCOUNTER — Ambulatory Visit (HOSPITAL_COMMUNITY)
Admission: RE | Admit: 2020-09-02 | Discharge: 2020-09-02 | Disposition: A | Discharge status: Home / Self Care | Payer: PRIVATE HEALTH INSURANCE | Source: Ambulatory Visit | Attending: Adult Health | Admitting: Adult Health

## 2020-09-02 DIAGNOSIS — Z1231 Encounter for screening mammogram for malignant neoplasm of breast: Secondary | ICD-10-CM | POA: Insufficient documentation

## 2020-09-04 ENCOUNTER — Other Ambulatory Visit (HOSPITAL_COMMUNITY): Payer: Self-pay | Admitting: Internal Medicine

## 2020-09-04 ENCOUNTER — Ambulatory Visit (HOSPITAL_COMMUNITY)
Admission: RE | Admit: 2020-09-04 | Discharge: 2020-09-04 | Disposition: A | Discharge status: Home / Self Care | Payer: PRIVATE HEALTH INSURANCE | Source: Ambulatory Visit | Attending: Internal Medicine | Admitting: Internal Medicine

## 2020-09-04 DIAGNOSIS — M79672 Pain in left foot: Secondary | ICD-10-CM

## 2021-03-10 ENCOUNTER — Telehealth: Payer: Self-pay | Admitting: Adult Health

## 2021-03-10 MED ORDER — DOXYCYCLINE HYCLATE 100 MG PO TABS: 100.0000 mg | ORAL_TABLET | Freq: Two times a day (BID) | ORAL | 0 refills | Status: DC

## 2021-03-10 NOTE — Telephone Encounter (Signed)
Pt with uti sx. Wanted to see if you can call in rx for abx.

## 2021-03-10 NOTE — Telephone Encounter (Signed)
Patient calling stating that she doesn't remember which medicine that you scripted for her for her utis, she states that she has another. She is having lower back pain, urine smells and burns when she pees, if could send another script to [facility]

## 2021-03-10 NOTE — Addendum Note (Signed)
Addended by: Derrek Monaco A on: 03/10/2021 04:43 PM   Modules accepted: Orders

## 2021-03-10 NOTE — Telephone Encounter (Signed)
Pt aware that rx sent in for doxycycline

## 2021-06-25 IMAGING — DX DG FOOT COMPLETE 3+V*L*
3 series · 3 of 3 positions shown · non-contrast
Comparison: None.

CLINICAL DATA: Acute left foot pain without known injury.

EXAM:
LEFT FOOT - COMPLETE 3+ VIEW

[foot ap]
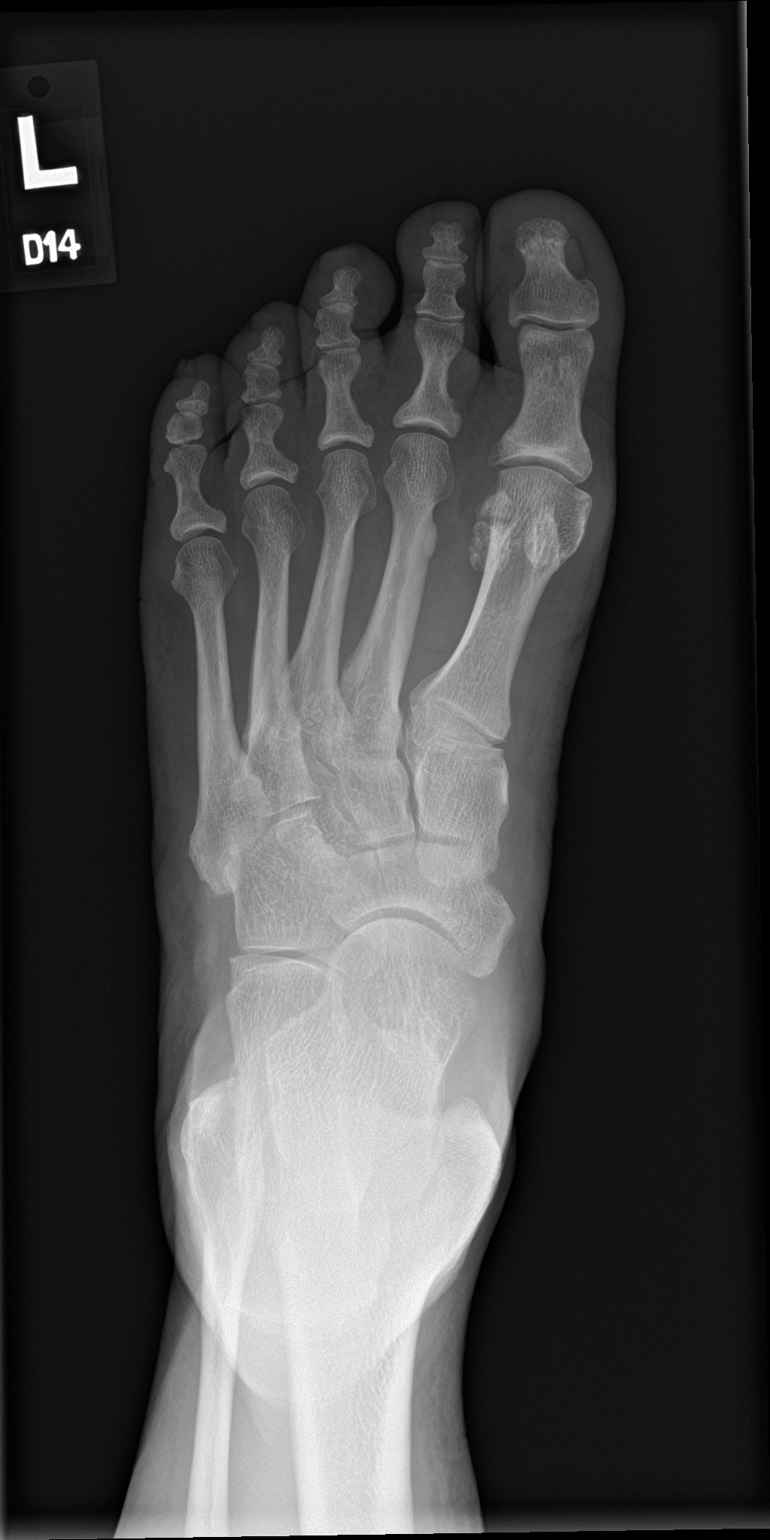

[foot obl]
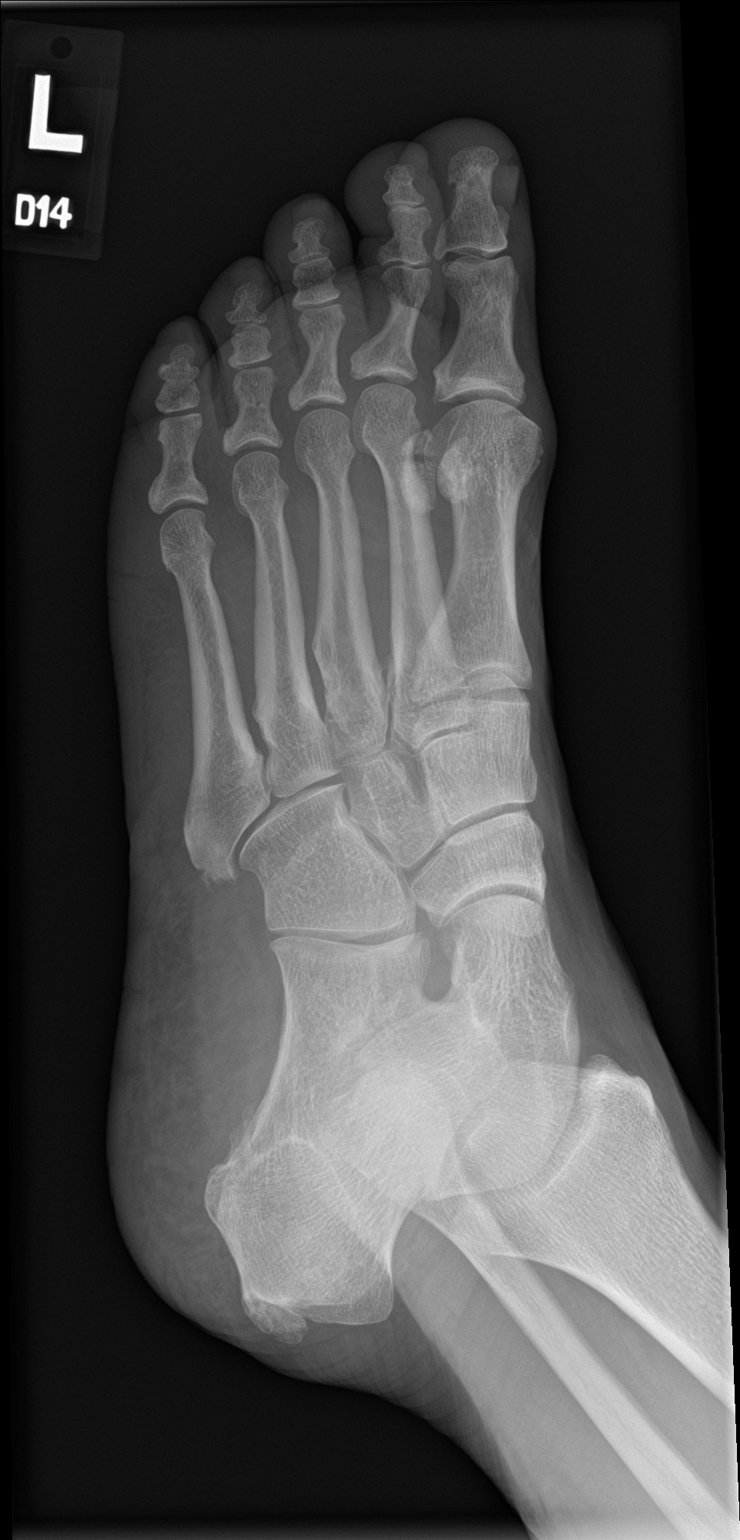

[foot lat]
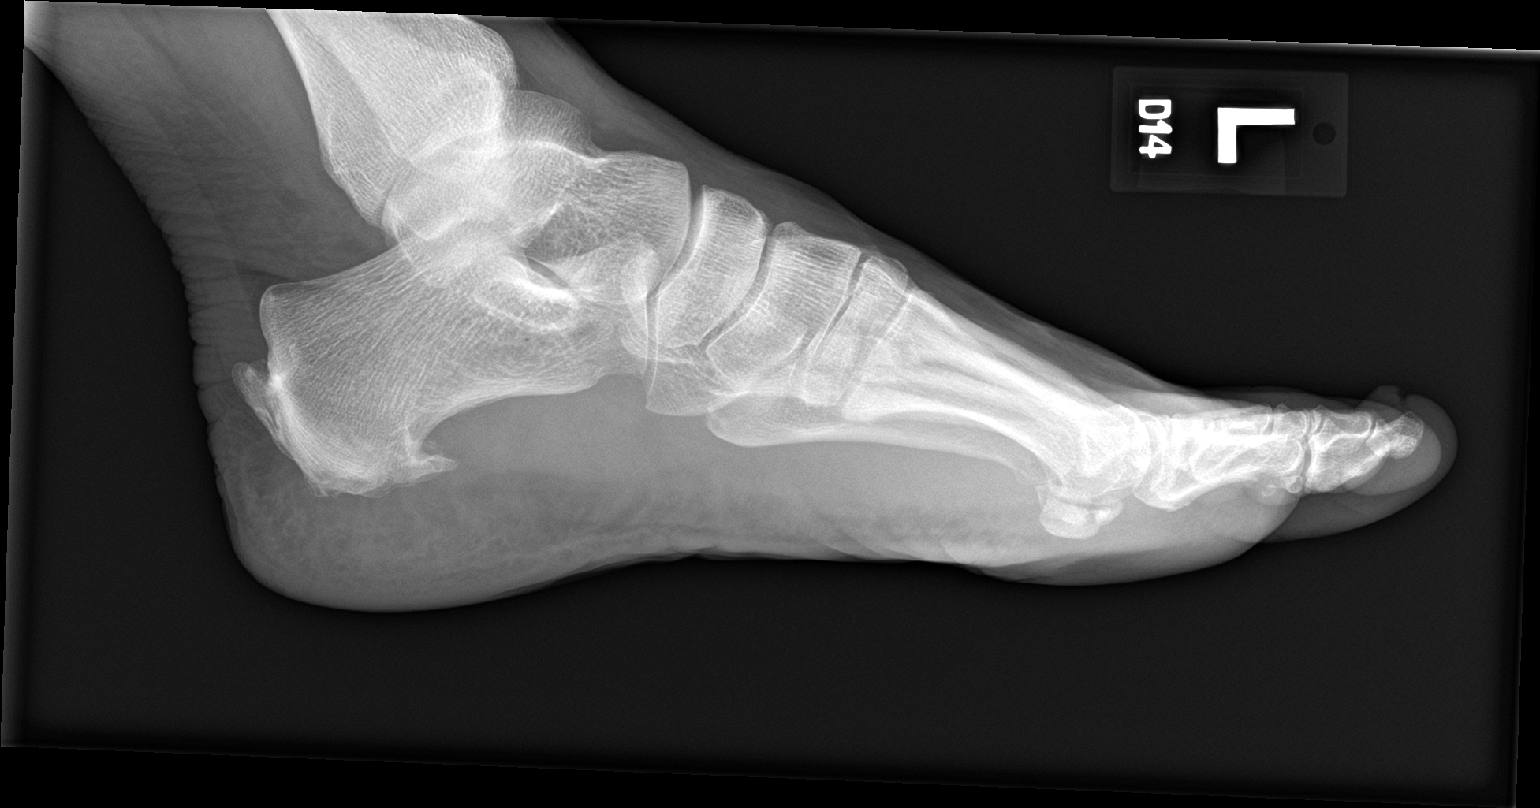

[3 of 3 positions shown; findings below may reference images not displayed]

FINDINGS: There is no evidence of fracture or dislocation. There is no
evidence of arthropathy. Moderate posterior calcaneal spurring is
noted. Soft tissues are unremarkable.
IMPRESSION: No acute abnormality seen.

## 2021-08-22 ENCOUNTER — Ambulatory Visit
Admission: EM | Admit: 2021-08-22 | Discharge: 2021-08-22 | Disposition: A | Discharge status: Home / Self Care | Payer: PRIVATE HEALTH INSURANCE | Attending: Urgent Care | Admitting: Urgent Care

## 2021-08-22 ENCOUNTER — Other Ambulatory Visit: Payer: Self-pay

## 2021-08-22 DIAGNOSIS — M546 Pain in thoracic spine: Secondary | ICD-10-CM

## 2021-08-22 DIAGNOSIS — S29012A Strain of muscle and tendon of back wall of thorax, initial encounter: Secondary | ICD-10-CM

## 2021-08-22 DIAGNOSIS — M545 Low back pain, unspecified: Secondary | ICD-10-CM

## 2021-08-22 DIAGNOSIS — S39012A Strain of muscle, fascia and tendon of lower back, initial encounter: Secondary | ICD-10-CM | POA: Diagnosis not present

## 2021-08-22 MED ORDER — PREDNISONE 20 MG PO TABS: ORAL_TABLET | ORAL | 0 refills | Status: DC

## 2021-08-22 MED ORDER — METHYLPREDNISOLONE SODIUM SUCC 125 MG IJ SOLR
125.0000 mg | Freq: Once | INTRAMUSCULAR | Status: AC
  Administered 2021-08-22: 125 mg via INTRAMUSCULAR

## 2021-08-22 MED ORDER — TIZANIDINE HCL 4 MG PO TABS: 4.0000 mg | ORAL_TABLET | Freq: Every day | ORAL | 0 refills | Status: DC

## 2021-08-22 NOTE — ED Provider Notes (Signed)
?[place] ? ? ?MRN: [id number] DOB: 11-17-1960 ? ?Subjective:  ? ?Gwendolyn Sweeney is a 61 y.o. female presenting for 1 day history of acute onset severe left-sided thoracic back pain, right-sided low back pain, general back tightness.  Symptoms started without any known trauma, falls.  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  She does not hydrate with water very much.  No history of musculoskeletal disorders, osteoporosis.  No weakness, numbness or tingling. ? ?No current facility-administered medications for this encounter. ? ?Current Outpatient Medications:  ?  cetirizine (ZYRTEC ALLERGY) 10 MG tablet, Take 1 tablet (10 mg total) by mouth daily., Disp:  , Rfl:  ?  doxycycline (VIBRA-TABS) 100 MG tablet, Take 1 tablet (100 mg total) by mouth 2 (two) times daily., Disp: 20 tablet, Rfl: 0 ?  levothyroxine (SYNTHROID, LEVOTHROID) 200 MCG tablet, Take 200 mcg by mouth daily before breakfast., Disp: , Rfl:   ? ?Allergies  ?Allergen Reactions  ? Hydrocodone Itching  ? Ampicillin Rash  ? ? ?Past Medical History:  ?Diagnosis Date  ? Fatigue 04/08/2013  ? Fatty liver 10/11/2016  ? Pelvic floor relaxation 03/23/2015  ? Thyroid disease   ? Ulcer   ? Urinary tract bacterial infections   ?  ? ?Past Surgical History:  ?Procedure Laterality Date  ? ABDOMINAL HYSTERECTOMY    ? carpel tunnel    ? COLONOSCOPY N/A 01/20/2016  ? Procedure: COLONOSCOPY;  Surgeon: Rogene Houston, MD;  Location: [facility];  Service: Endoscopy;  Laterality: N/A;  830  ? HERNIA REPAIR    ? TUBAL LIGATION    ? WISDOM TOOTH EXTRACTION    ? ? ?Family History  ?Problem Relation Age of Onset  ? Heart disease Mother   ? Heart disease Father   ? Diabetes Daughter   ? Diabetes Paternal Grandfather   ? Heart disease Paternal Grandfather   ? Macular degeneration Paternal Grandfather   ? Glaucoma Maternal Grandmother   ? Macular degeneration Sister   ? Other Other   ?     paternal 2nd cousin-tested + for  gene for breast cancer  ? ? ?Social History  ? ?Tobacco Use  ? Smoking status: Never  ? Smokeless tobacco: Never  ?Vaping Use  ? Vaping Use: Never used  ?Substance Use Topics  ? Alcohol use: No  ? Drug use: No  ? ? ?ROS ? ? ?Objective:  ? ?Vitals: ?BP (!) 174/89 (BP Location: Right Arm)   Pulse 83   Temp 99 ?F (37.2 ?C) (Oral)   Resp 18   SpO2 96%  ? ?Physical Exam ?Constitutional:   ?   General: She is not in acute distress. ?   Appearance: Normal appearance. She is well-developed. She is not ill-appearing, toxic-appearing or diaphoretic.  ?HENT:  ?   Head: Normocephalic and atraumatic.  ?   Nose: Nose normal.  ?   Mouth/Throat:  ?   Mouth: Mucous membranes are moist.  ?Eyes:  ?   General: No scleral icterus.    ?   Right eye: No discharge.     ?   Left eye: No discharge.  ?   Extraocular Movements: Extraocular movements intact.  ?Cardiovascular:  ?   Rate and Rhythm: Normal rate.  ?Pulmonary:  ?   Effort: Pulmonary effort is normal.  ?Musculoskeletal:  ?   Comments: Full range of motion throughout.  Strength 5/5 for upper and lower extremities.  Patient ambulates without any assistance at expected pace.  No ecchymosis, swelling, lacerations or abrasions.  Patient does have paraspinal muscle tenderness along the entire back excluding the midline.  Paraspinal muscle tenderness worse over the left upper/thoracic side and the right lower lumbar extending to the superior posterior hip.  Negative straight leg raise bilaterally.  ?Skin: ?   General: Skin is warm and dry.  ?Neurological:  ?   General: No focal deficit present.  ?   Mental Status: She is alert and oriented to person, place, and time.  ?   Coordination: Coordination normal.  ?   Gait: Gait normal.  ?   Deep Tendon Reflexes: Reflexes normal.  ?Psychiatric:     ?   Mood and Affect: Mood normal.     ?   Behavior: Behavior normal.     ?   Thought Content: Thought content normal.     ?   Judgment: Judgment normal.  ? ?IM Solu-Medrol in clinic. ? ?Assessment  and Plan :  ? ?PDMP not reviewed this encounter. ? ?1. Upper back strain, initial encounter   ?2. Low back strain, initial encounter   ?3. Acute left-sided thoracic back pain   ?4. Acute right-sided low back pain without sciatica   ? ?Ordered address her severe pain with an oral prednisone course which the patient would like to do.  Use tizanidine at night.  Discussed back care.  Recommended better hydration especially if she is can be physically active.  As there is a lack of trauma, deferred imaging.  Counseled patient on potential for adverse effects with medications prescribed/recommended today, ER and return-to-clinic precautions discussed, patient verbalized understanding. ? ?  ?Jaynee Eagles, PA-C ?08/22/21 1331 ? ?

## 2021-08-22 NOTE — ED Triage Notes (Signed)
Pt reports right sided lower back pain and left scapular pain x 1 day after she pick up a heavy bag. Pain is worse when moving around.  ?

## 2021-12-22 ENCOUNTER — Other Ambulatory Visit (HOSPITAL_COMMUNITY): Payer: Self-pay | Admitting: Internal Medicine

## 2021-12-22 DIAGNOSIS — Z1231 Encounter for screening mammogram for malignant neoplasm of breast: Secondary | ICD-10-CM

## 2022-01-06 ENCOUNTER — Ambulatory Visit (HOSPITAL_COMMUNITY)
Admission: RE | Admit: 2022-01-06 | Discharge: 2022-01-06 | Disposition: A | Discharge status: Home / Self Care | Payer: PRIVATE HEALTH INSURANCE | Source: Ambulatory Visit | Attending: Internal Medicine | Admitting: Internal Medicine

## 2022-01-06 DIAGNOSIS — Z1231 Encounter for screening mammogram for malignant neoplasm of breast: Secondary | ICD-10-CM | POA: Insufficient documentation

## 2022-02-01 ENCOUNTER — Encounter (HOSPITAL_COMMUNITY): Payer: Self-pay

## 2022-02-01 ENCOUNTER — Other Ambulatory Visit: Payer: Self-pay | Admitting: Internal Medicine

## 2022-02-01 ENCOUNTER — Other Ambulatory Visit (HOSPITAL_COMMUNITY): Payer: Self-pay | Admitting: Internal Medicine

## 2022-02-01 ENCOUNTER — Ambulatory Visit (HOSPITAL_COMMUNITY)
Admission: RE | Admit: 2022-02-01 | Discharge: 2022-02-01 | Disposition: A | Discharge status: Home / Self Care | Payer: PRIVATE HEALTH INSURANCE | Source: Ambulatory Visit | Attending: Internal Medicine | Admitting: Internal Medicine

## 2022-02-01 DIAGNOSIS — R071 Chest pain on breathing: Secondary | ICD-10-CM | POA: Insufficient documentation

## 2022-02-01 MED ORDER — IOHEXOL 350 MG/ML SOLN
100.0000 mL | Freq: Once | INTRAVENOUS | Status: AC | PRN
  Administered 2022-02-01: 75 mL via INTRAVENOUS

## 2022-05-29 ENCOUNTER — Ambulatory Visit
Admission: EM | Admit: 2022-05-29 | Discharge: 2022-05-29 | Disposition: A | Discharge status: Home / Self Care | Payer: PRIVATE HEALTH INSURANCE | Attending: Nurse Practitioner | Admitting: Nurse Practitioner

## 2022-05-29 DIAGNOSIS — J309 Allergic rhinitis, unspecified: Secondary | ICD-10-CM | POA: Diagnosis not present

## 2022-05-29 MED ORDER — PSEUDOEPH-BROMPHEN-DM 30-2-10 MG/5ML PO SYRP: 5.0000 mL | ORAL_SOLUTION | Freq: Four times a day (QID) | ORAL | 0 refills | Status: DC | PRN

## 2022-05-29 MED ORDER — LEVOCETIRIZINE DIHYDROCHLORIDE 5 MG PO TABS: 5.0000 mg | ORAL_TABLET | Freq: Every evening | ORAL | 0 refills | Status: AC

## 2022-05-29 NOTE — ED Provider Notes (Signed)
[facility]    CSN: [id number] Arrival date & time: 05/29/22  0805      History   Chief Complaint No chief complaint on file.   HPI Gwendolyn Sweeney is a 61 y.o. female.   The history is provided by the patient.   The patient presents for complaints of scratchy eyes, runny nose, and a cough that is been present over the past 2 days.  Patient denies fever, chills, headache, sore throat, wheezing, shortness of breath difficulty breathing, or GI symptoms.  Patient reports she has been taking Mucinex for her symptoms with minimal relief.  Patient reports a history of seasonal allergies.  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  Reports that she does use Flonase occasionally along with other allergy medications, but does not use them on a daily basis.  Past Medical History:  Diagnosis Date   Fatigue 04/08/2013   Fatty liver 10/11/2016   Pelvic floor relaxation 03/23/2015   Thyroid disease    Ulcer    Urinary tract bacterial infections     Patient Active Problem List   Diagnosis Date Noted   Encounter for screening fecal occult blood testing 08/10/2020   Visit for pelvic exam 08/10/2020   S/P hysterectomy 08/10/2020   Dysuria 08/10/2020   Hematuria 08/10/2020   Fatty liver 10/11/2016   Rectocele 08/08/2016   Pelvic floor relaxation 03/23/2015   Fatigue 04/08/2013    Past Surgical History:  Procedure Laterality Date   ABDOMINAL HYSTERECTOMY     carpel tunnel     COLONOSCOPY N/A 01/20/2016   Procedure: COLONOSCOPY;  Surgeon: Rogene Houston, MD;  Location: [facility];  Service: Endoscopy;  Laterality: N/A;  [place]     WISDOM TOOTH EXTRACTION      OB History     Gravida  4   Para  3   Term      Preterm      AB  1   Living  3      SAB      IAB      Ectopic  1   Multiple      Live Births  2            Home Medications    Prior to Admission medications    Medication Sig Start Date End Date Taking? Authorizing Provider  brompheniramine-pseudoephedrine-DM 30-2-10 MG/5ML syrup Take 5 mLs by mouth 4 (four) times daily as needed. 05/29/22  Yes Manpreet Strey-Warren, Alda Lea, NP  levocetirizine (XYZAL) 5 MG tablet Take 1 tablet (5 mg total) by mouth every evening. 05/29/22  Yes Maripat Stabenow-Warren, Alda Lea, NP  cetirizine (ZYRTEC ALLERGY) 10 MG tablet Take 1 tablet (10 mg total) by mouth daily. 01/01/19   Estill Dooms, NP  doxycycline (VIBRA-TABS) 100 MG tablet Take 1 tablet (100 mg total) by mouth 2 (two) times daily. 03/10/21   Estill Dooms, NP  levothyroxine (SYNTHROID, LEVOTHROID) 200 MCG tablet Take 200 mcg by mouth daily before breakfast.    [provider]  predniSONE (DELTASONE) 20 MG tablet Take 2 tablets daily with breakfast. 08/22/21   Jaynee Eagles, PA-C  tiZANidine (ZANAFLEX) 4 MG tablet Take 1 tablet (4 mg total) by mouth at bedtime. 08/22/21   Jaynee Eagles, PA-C    Family History Family History  Problem Relation Age of Onset   Heart disease Mother    Heart disease Father  Diabetes Daughter    Diabetes Paternal Grandfather    Heart disease Paternal Grandfather    Macular degeneration Paternal Grandfather    Glaucoma Maternal Grandmother    Macular degeneration Sister    Other Other        paternal 2nd cousin-tested + for gene for breast cancer    Social History Social History   Tobacco Use   Smoking status: Never   Smokeless tobacco: Never  Vaping Use   Vaping Use: Never used  Substance Use Topics   Alcohol use: No   Drug use: No     Allergies   Hydrocodone and Ampicillin   Review of Systems Review of Systems Per HPI  Physical Exam Triage Vital Signs ED Triage Vitals  Enc Vitals Group     BP 05/29/22 0815 (!) 175/92     Pulse Rate 05/29/22 0815 70     Resp 05/29/22 0815 20     Temp 05/29/22 0815 98.2 F (36.8 C)     Temp Source 05/29/22 0815 Oral     SpO2 05/29/22 0815 98 %     Weight --       Height --      Head Circumference --      Peak Flow --      Pain Score 05/29/22 0817 0     Pain Loc --      Pain Edu? --      Excl. in [place]? --    No data found.  Updated Vital Signs BP (!) 175/92 (BP Location: Right Arm)   Pulse 70   Temp 98.2 F (36.8 C) (Oral)   Resp 20   SpO2 98%   Visual Acuity Right Eye Distance:   Left Eye Distance:   Bilateral Distance:    Right Eye Near:   Left Eye Near:    Bilateral Near:     Physical Exam Vitals and nursing note reviewed.  Constitutional:      General: She is not in acute distress.    Appearance: Normal appearance.  HENT:     Head: Normocephalic.     Right Ear: Tympanic membrane, ear canal and external ear normal.     Left Ear: Tympanic membrane, ear canal and external ear normal.     Nose: Congestion present.     Right Turbinates: Enlarged and swollen.     Left Turbinates: Enlarged and swollen.     Right Sinus: No maxillary sinus tenderness or frontal sinus tenderness.     Left Sinus: No maxillary sinus tenderness or frontal sinus tenderness.     Mouth/Throat:     Lips: Pink.     Mouth: Mucous membranes are moist.     Pharynx: Oropharynx is clear. Uvula midline. Posterior oropharyngeal erythema present. No pharyngeal swelling.  Eyes:     Extraocular Movements: Extraocular movements intact.     Conjunctiva/sclera: Conjunctivae normal.     Pupils: Pupils are equal, round, and reactive to light.  Cardiovascular:     Rate and Rhythm: Normal rate and regular rhythm.     Pulses: Normal pulses.     Heart sounds: Normal heart sounds.  Pulmonary:     Effort: Pulmonary effort is normal. No respiratory distress.     Breath sounds: Normal breath sounds. No stridor. No wheezing, rhonchi or rales.  Abdominal:     General: Bowel sounds are normal.     Palpations: Abdomen is soft.     Tenderness: There is no abdominal tenderness.  Musculoskeletal:  Cervical back: Normal range of motion.  Lymphadenopathy:     Cervical: No  cervical adenopathy.  Skin:    General: Skin is warm and dry.  Neurological:     General: No focal deficit present.     Mental Status: She is alert and oriented to person, place, and time.  Psychiatric:        Mood and Affect: Mood normal.        Behavior: Behavior normal.      UC Treatments / Results  Labs (all labs ordered are listed, but only abnormal results are displayed) Labs Reviewed - No data to display  EKG   Radiology No results found.  Procedures Procedures (including critical care time)  Medications Ordered in UC Medications - No data to display  Initial Impression / Assessment and Plan / UC Course  I have reviewed the triage vital signs and the nursing notes.  Pertinent labs & imaging results that were available during my care of the patient were reviewed by me and considered in my medical decision making (see chart for details).  The patient is well-appearing, she is in no acute distress, vital signs are stable.  Symptoms appear to be consistent with allergic rhinitis.  Will start patient on levocetirizine 5 mg, and Bromfed for her cough.  Patient reports that she does have Flonase, advised patient to use her Flonase daily to prevent triggers of her allergies.  Supportive care recommendations were provided to the patient along with strict return precautions.  Patient verbalizes understanding.  All questions were answered.  Patient is stable for discharge.   Final Clinical Impressions(s) / UC Diagnoses   Final diagnoses:  Allergic rhinitis, unspecified seasonality, unspecified trigger     Discharge Instructions      Take medication as prescribed. Increase fluids and allow for plenty of rest. Recommend Tylenol or ibuprofen as needed for pain, fever, or general discomfort. Warm salt water gargles 3-4 times daily as needed to help with throat pain or discomfort. Recommend using a humidifier at bedtime during sleep to help with cough and nasal  congestion. Sleep elevated on 2 pillows while symptoms persist. Follow up with your PCP if symptoms do not improve.     ED Prescriptions     Medication Sig Dispense Auth. Provider   brompheniramine-pseudoephedrine-DM 30-2-10 MG/5ML syrup Take 5 mLs by mouth 4 (four) times daily as needed. 140 mL Lashawn Orrego-Warren, Alda Lea, NP   levocetirizine (XYZAL) 5 MG tablet Take 1 tablet (5 mg total) by mouth every evening. 30 tablet Terah Robey-Warren, Alda Lea, NP      PDMP not reviewed this encounter.   Tish Men, NP 05/29/22 [phone]

## 2022-05-29 NOTE — Discharge Instructions (Addendum)
Take medication as prescribed. Increase fluids and allow for plenty of rest. Recommend Tylenol or ibuprofen as needed for pain, fever, or general discomfort. Warm salt water gargles 3-4 times daily as needed to help with throat pain or discomfort. Recommend using a humidifier at bedtime during sleep to help with cough and nasal congestion. Sleep elevated on 2 pillows while symptoms persist. Follow up with your PCP if symptoms do not improve.

## 2022-05-29 NOTE — ED Triage Notes (Signed)
Pt reports "scratchy eyes" runny nose and a cough x 2 days. Took mucinex but no relief.

## 2022-08-01 ENCOUNTER — Telehealth: Payer: Self-pay | Admitting: Adult Health

## 2022-08-01 MED ORDER — DOXYCYCLINE HYCLATE 100 MG PO TABS: 100.0000 mg | ORAL_TABLET | Freq: Two times a day (BID) | ORAL | 0 refills | Status: DC

## 2022-08-01 NOTE — Telephone Encounter (Signed)
Patient wanted a message sent to [place] stating that she might have a UTI again. Wouldn't give me anymore information stating that just wanted a message sent back to her for Jenn to call her.

## 2022-08-01 NOTE — Telephone Encounter (Signed)
Left message that rx sent to [facility]

## 2022-08-01 NOTE — Addendum Note (Signed)
Addended by: Derrek Monaco A on: 08/01/2022 12:33 PM   Modules accepted: Orders

## 2022-12-05 DIAGNOSIS — Z8249 Family history of ischemic heart disease and other diseases of the circulatory system: Secondary | ICD-10-CM | POA: Diagnosis not present

## 2022-12-05 DIAGNOSIS — Z6836 Body mass index (BMI) 36.0-36.9, adult: Secondary | ICD-10-CM | POA: Diagnosis not present

## 2022-12-05 DIAGNOSIS — E039 Hypothyroidism, unspecified: Secondary | ICD-10-CM | POA: Diagnosis not present

## 2022-12-05 DIAGNOSIS — E669 Obesity, unspecified: Secondary | ICD-10-CM | POA: Diagnosis not present

## 2022-12-05 DIAGNOSIS — I10 Essential (primary) hypertension: Secondary | ICD-10-CM | POA: Diagnosis not present

## 2022-12-05 DIAGNOSIS — Z809 Family history of malignant neoplasm, unspecified: Secondary | ICD-10-CM | POA: Diagnosis not present

## 2022-12-05 DIAGNOSIS — Z833 Family history of diabetes mellitus: Secondary | ICD-10-CM | POA: Diagnosis not present

## 2022-12-05 DIAGNOSIS — Z85828 Personal history of other malignant neoplasm of skin: Secondary | ICD-10-CM | POA: Diagnosis not present

## 2022-12-05 DIAGNOSIS — Z885 Allergy status to narcotic agent status: Secondary | ICD-10-CM | POA: Diagnosis not present

## 2022-12-26 ENCOUNTER — Encounter (INDEPENDENT_AMBULATORY_CARE_PROVIDER_SITE_OTHER): Payer: Self-pay | Admitting: *Deleted

## 2023-01-26 ENCOUNTER — Other Ambulatory Visit (HOSPITAL_COMMUNITY): Payer: Self-pay | Admitting: Internal Medicine

## 2023-01-26 ENCOUNTER — Encounter (INDEPENDENT_AMBULATORY_CARE_PROVIDER_SITE_OTHER): Payer: Self-pay | Admitting: *Deleted

## 2023-01-26 DIAGNOSIS — Z1231 Encounter for screening mammogram for malignant neoplasm of breast: Secondary | ICD-10-CM

## 2023-02-01 ENCOUNTER — Encounter (HOSPITAL_COMMUNITY): Payer: Self-pay

## 2023-02-01 ENCOUNTER — Ambulatory Visit (HOSPITAL_COMMUNITY)
Admission: RE | Admit: 2023-02-01 | Discharge: 2023-02-01 | Disposition: A | Discharge status: Home / Self Care | Payer: 59 | Source: Ambulatory Visit | Attending: Internal Medicine | Admitting: Internal Medicine

## 2023-02-01 DIAGNOSIS — Z1231 Encounter for screening mammogram for malignant neoplasm of breast: Secondary | ICD-10-CM | POA: Insufficient documentation

## 2023-02-03 ENCOUNTER — Telehealth: Payer: Self-pay | Admitting: *Deleted

## 2023-02-03 NOTE — Telephone Encounter (Signed)
-----   Message from Cyril Mourning sent at 02/03/2023  9:22 AM EDT ----- Let her know mammogram was negative THX

## 2023-02-03 NOTE — Telephone Encounter (Signed)
Pt aware mammogram was negative. Pt voiced understanding. Pt wanted to make an appt for physical. Call transferred to [facility] for appt. JSY

## 2023-02-13 ENCOUNTER — Ambulatory Visit
Admission: EM | Admit: 2023-02-13 | Discharge: 2023-02-13 | Disposition: A | Discharge status: Home / Self Care | Payer: 59 | Attending: Nurse Practitioner | Admitting: Nurse Practitioner

## 2023-02-13 DIAGNOSIS — R399 Unspecified symptoms and signs involving the genitourinary system: Secondary | ICD-10-CM | POA: Insufficient documentation

## 2023-02-13 LAB — POCT URINALYSIS DIP (MANUAL ENTRY)
Bilirubin, UA: NEGATIVE
Blood, UA: NEGATIVE
Glucose, UA: NEGATIVE mg/dL
Ketones, POC UA: NEGATIVE mg/dL
Nitrite, UA: NEGATIVE
Protein Ur, POC: NEGATIVE mg/dL
Spec Grav, UA: 1.025 (ref 1.010–1.025)
Urobilinogen, UA: 0.2 U/dL
pH, UA: 5 (ref 5.0–8.0)

## 2023-02-13 MED ORDER — SULFAMETHOXAZOLE-TRIMETHOPRIM 800-160 MG PO TABS: 1.0000 | ORAL_TABLET | Freq: Two times a day (BID) | ORAL | 0 refills | Status: AC

## 2023-02-13 NOTE — ED Provider Notes (Signed)
[facility]    CSN: [id number] Arrival date & time: 02/13/23  0818      History   Chief Complaint Chief Complaint  Patient presents with   Urinary Frequency    HPI Gwendolyn Sweeney is a 62 y.o. female.   The history is provided by the patient.   Patient presents with a 2-day history of lower abdominal pain and pressure, urinary frequency/urgency and low back pain.  Denies fever, chills, chest pain, nausea, vomiting, diarrhea, hematuria, dysuria, decreased urine stream, or flank pain.  Patient reports that she cannot recall when she had her last UTI, but states that she does have a history of same.  Denies prior history of kidney stones or kidney infection.  Past Medical History:  Diagnosis Date   Fatigue 04/08/2013   Fatty liver 10/11/2016   Pelvic floor relaxation 03/23/2015   Thyroid disease    Ulcer    Urinary tract bacterial infections     Patient Active Problem List   Diagnosis Date Noted   Encounter for screening fecal occult blood testing 08/10/2020   Visit for pelvic exam 08/10/2020   S/P hysterectomy 08/10/2020   Dysuria 08/10/2020   Hematuria 08/10/2020   Fatty liver 10/11/2016   Rectocele 08/08/2016   Pelvic floor relaxation 03/23/2015   Fatigue 04/08/2013    Past Surgical History:  Procedure Laterality Date   ABDOMINAL HYSTERECTOMY     carpel tunnel     COLONOSCOPY N/A 01/20/2016   Procedure: COLONOSCOPY;  Surgeon: Malissa Hippo, MD;  Location: [facility];  Service: Endoscopy;  Laterality: N/A;  830   HERNIA REPAIR     TUBAL LIGATION     WISDOM TOOTH EXTRACTION      OB History     Gravida  4   Para  3   Term      Preterm      AB  1   Living  3      SAB      IAB      Ectopic  1   Multiple      Live Births  2            Home Medications    Prior to Admission medications   Medication Sig Start Date End Date Taking? Authorizing Provider  levothyroxine (SYNTHROID, LEVOTHROID) 200 MCG tablet Take 200 mcg by  mouth daily before breakfast.   Yes [provider]  Multiple Vitamin (MULTIVITAMIN) capsule Take 1 capsule by mouth daily.   Yes [provider]  sulfamethoxazole-trimethoprim (BACTRIM DS) 800-160 MG tablet Take 1 tablet by mouth 2 (two) times daily for 7 days. 02/13/23 02/20/23 Yes Mella Inclan-Warren, Sadie Haber, NP  brompheniramine-pseudoephedrine-DM 30-2-10 MG/5ML syrup Take 5 mLs by mouth 4 (four) times daily as needed. 05/29/22   Kelilah Hebard-Warren, Sadie Haber, NP  cetirizine (ZYRTEC ALLERGY) 10 MG tablet Take 1 tablet (10 mg total) by mouth daily. 01/01/19   Adline Potter, NP  doxycycline (VIBRA-TABS) 100 MG tablet Take 1 tablet (100 mg total) by mouth 2 (two) times daily. 08/01/22   Adline Potter, NP  levocetirizine (XYZAL) 5 MG tablet Take 1 tablet (5 mg total) by mouth every evening. 05/29/22   Kristy Schomburg-Warren, Sadie Haber, NP  predniSONE (DELTASONE) 20 MG tablet Take 2 tablets daily with breakfast. 08/22/21   Wallis Bamberg, PA-C  tiZANidine (ZANAFLEX) 4 MG tablet Take 1 tablet (4 mg total) by mouth at bedtime. 08/22/21   Wallis Bamberg, PA-C    Family History  Family History  Problem Relation Age of Onset   Heart disease Mother    Heart disease Father    Diabetes Daughter    Diabetes Paternal Grandfather    Heart disease Paternal Grandfather    Macular degeneration Paternal Grandfather    Glaucoma Maternal Grandmother    Macular degeneration Sister    Other Other        paternal 2nd cousin-tested + for gene for breast cancer    Social History Social History   Tobacco Use   Smoking status: Never   Smokeless tobacco: Never  Vaping Use   Vaping status: Never Used  Substance Use Topics   Alcohol use: No   Drug use: No     Allergies   Hydrocodone and Ampicillin   Review of Systems Review of Systems Per HPI  Physical Exam Triage Vital Signs ED Triage Vitals  Encounter Vitals Group     BP 02/13/23 0934 (!) 154/90     Systolic BP Percentile --      Diastolic  BP Percentile --      Pulse Rate 02/13/23 0934 83     Resp 02/13/23 0934 16     Temp 02/13/23 0934 98.4 F (36.9 C)     Temp Source 02/13/23 0934 Oral     SpO2 02/13/23 0934 95 %     Weight --      Height --      Head Circumference --      Peak Flow --      Pain Score 02/13/23 0935 2     Pain Loc --      Pain Education --      Exclude from Growth Chart --    No data found.  Updated Vital Signs BP (!) 154/90 (BP Location: Right Arm)   Pulse 83   Temp 98.4 F (36.9 C) (Oral)   Resp 16   SpO2 95%   Visual Acuity Right Eye Distance:   Left Eye Distance:   Bilateral Distance:    Right Eye Near:   Left Eye Near:    Bilateral Near:     Physical Exam Vitals and nursing note reviewed.  Constitutional:      General: She is not in acute distress.    Appearance: Normal appearance.  HENT:     Head: Normocephalic.  Eyes:     Extraocular Movements: Extraocular movements intact.     Pupils: Pupils are equal, round, and reactive to light.  Cardiovascular:     Rate and Rhythm: Normal rate and regular rhythm.     Pulses: Normal pulses.     Heart sounds: Normal heart sounds.  Pulmonary:     Effort: Pulmonary effort is normal. No respiratory distress.     Breath sounds: Normal breath sounds. No stridor. No wheezing, rhonchi or rales.  Abdominal:     General: Bowel sounds are normal.     Palpations: Abdomen is soft.     Tenderness: There is no abdominal tenderness. There is no right CVA tenderness or left CVA tenderness.  Musculoskeletal:     Cervical back: Normal range of motion.  Skin:    General: Skin is warm and dry.  Neurological:     General: No focal deficit present.     Mental Status: She is alert and oriented to person, place, and time.  Psychiatric:        Mood and Affect: Mood normal.        Behavior: Behavior normal.  UC Treatments / Results  Labs (all labs ordered are listed, but only abnormal results are displayed) Labs Reviewed  POCT URINALYSIS  DIP (MANUAL ENTRY) - Abnormal; Notable for the following components:      Result Value   Leukocytes, UA Trace (*)    All other components within normal limits  URINE CULTURE    EKG   Radiology No results found.  Procedures Procedures (including critical care time)  Medications Ordered in UC Medications - No data to display  Initial Impression / Assessment and Plan / UC Course  I have reviewed the triage vital signs and the nursing notes.  Pertinent labs & imaging results that were available during my care of the patient were reviewed by me and considered in my medical decision making (see chart for details).  The patient is well-appearing, she is in no acute distress, vital signs are stable.  Urinalysis does not indicate an obvious UTI, will provide treatment for UTI based on patient's current symptoms.  Bactrim DS 800/160 mg tablets twice daily for the next 7 days prescribed.  Supportive care recommendations were provided and discussed with the patient to include over-the-counter Tylenol or ibuprofen, increasing her fluid intake, voiding every 2 hours, and avoiding caffeine.  Patient was given strict ER follow-up precautions.  Patient was advised that if the culture result is negative, she will need to stop the antibiotic and follow-up with her primary care physician for further evaluation.  Patient was also advised she will be contacted if the medication prescribed today needs to be changed based on the urine culture.  Patient is in agreement with this plan of care and verbalizes understanding.  All questions were answered.  Patient stable for discharge.  Final Clinical Impressions(s) / UC Diagnoses   Final diagnoses:  UTI symptoms     Discharge Instructions      Urine culture is pending.  You will be contacted if the culture result shows that you do not have a urinary tract infection or if the medication needs to be changed. Take medication as prescribed. Increase fluids  and allow for plenty of rest.  Try to drink at least 8-10 8 ounce glasses of water while symptoms persist. May take over-the-counter Tylenol as needed for pain, fever, or general discomfort.   Avoid caffeine while symptoms persist to include tea, soda, or coffee. Develop a toileting schedule that will allow you to urinate every 2 hours while symptoms persist. Go to the emergency department immediately if you experience worsening urinary symptoms while taking the antibiotic. If your culture result is negative and you continue to experience symptoms, please follow-up with your primary care physician for further evaluation. Follow-up as needed.     ED Prescriptions     Medication Sig Dispense Auth. Provider   sulfamethoxazole-trimethoprim (BACTRIM DS) 800-160 MG tablet Take 1 tablet by mouth 2 (two) times daily for 7 days. 14 tablet Latrel Szymczak-Warren, Sadie Haber, NP      PDMP not reviewed this encounter.   Abran Cantor, NP 02/13/23 [phone]

## 2023-02-13 NOTE — Discharge Instructions (Addendum)
Urine culture is pending.  You will be contacted if the culture result shows that you do not have a urinary tract infection or if the medication needs to be changed. Take medication as prescribed. Increase fluids and allow for plenty of rest.  Try to drink at least 8-10 8 ounce glasses of water while symptoms persist. May take over-the-counter Tylenol as needed for pain, fever, or general discomfort.   Avoid caffeine while symptoms persist to include tea, soda, or coffee. Develop a toileting schedule that will allow you to urinate every 2 hours while symptoms persist. Go to the emergency department immediately if you experience worsening urinary symptoms while taking the antibiotic. If your culture result is negative and you continue to experience symptoms, please follow-up with your primary care physician for further evaluation. Follow-up as needed.

## 2023-02-13 NOTE — ED Triage Notes (Signed)
Lower back pain, lower abdominal pain and pressure, urinary frequency, urinary urgency that started Saturday.

## 2023-02-14 LAB — URINE CULTURE: Culture: <10000 — AB

## 2023-03-16 ENCOUNTER — Telehealth: Payer: Self-pay | Admitting: Internal Medicine

## 2023-03-16 ENCOUNTER — Encounter: Payer: Self-pay | Admitting: Adult Health

## 2023-03-16 ENCOUNTER — Ambulatory Visit (INDEPENDENT_AMBULATORY_CARE_PROVIDER_SITE_OTHER): Payer: 59 | Admitting: Adult Health

## 2023-03-16 VITALS — BP 145/89 | HR 73 | Ht 59.0 in | Wt 185.0 lb

## 2023-03-16 DIAGNOSIS — N8189 Other female genital prolapse: Secondary | ICD-10-CM | POA: Diagnosis not present

## 2023-03-16 DIAGNOSIS — Z1331 Encounter for screening for depression: Secondary | ICD-10-CM | POA: Diagnosis not present

## 2023-03-16 DIAGNOSIS — R350 Frequency of micturition: Secondary | ICD-10-CM | POA: Insufficient documentation

## 2023-03-16 DIAGNOSIS — Z90710 Acquired absence of both cervix and uterus: Secondary | ICD-10-CM

## 2023-03-16 DIAGNOSIS — N816 Rectocele: Secondary | ICD-10-CM

## 2023-03-16 DIAGNOSIS — N8110 Cystocele, unspecified: Secondary | ICD-10-CM

## 2023-03-16 DIAGNOSIS — Z01419 Encounter for gynecological examination (general) (routine) without abnormal findings: Secondary | ICD-10-CM

## 2023-03-16 DIAGNOSIS — Z1211 Encounter for screening for malignant neoplasm of colon: Secondary | ICD-10-CM

## 2023-03-16 LAB — HEMOCCULT GUIAC POC 1CARD (OFFICE): Fecal Occult Blood, POC: NEGATIVE

## 2023-03-16 LAB — POCT URINALYSIS DIPSTICK
Blood, UA: NEGATIVE
Glucose, UA: NEGATIVE
Ketones, UA: NEGATIVE
Nitrite, UA: NEGATIVE
Protein, UA: NEGATIVE

## 2023-03-16 NOTE — Progress Notes (Signed)
Patient ID: Gwendolyn Sweeney, female   DOB: 1961/03/17, 62 y.o.   MRN: [id number] History of Present Illness: Gwendolyn Sweeney is a 62 year old white female,married, sp hysterectomy in for a well woman gyn exam. She is having urinary frequency and heavy feeling after urination. 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  PCP is Dr Sherwood Gambler.   Current Medications, Allergies, Past Medical History, Past Surgical History, Family History and Social History were reviewed in [employer] electronic medical record.     Review of Systems: Patient denies any headaches, hearing loss, fatigue, blurred vision, shortness of breath, chest pain, abdominal pain, problems with bowel movements, or intercourse. No joint pain or mood swings.  See HPI for positives.   Physical Exam:BP (!) 145/89 (BP Location: Right Arm, Patient Position: Sitting, Cuff Size: Normal)   Pulse 73   Ht 4\' 11"  (1.499 m)   Wt 185 lb (83.9 kg)   BMI 37.37 kg/m  Urine trace leuks General:  Well developed, well nourished, no acute distress Skin:  Warm and dry Neck:  Midline trachea, normal thyroid, good ROM, no lymphadenopathy,no carotid bruits heard  Lungs; Clear to auscultation bilaterally Breast:  No dominant palpable mass, retraction, or nipple discharge Cardiovascular: Regular rate and rhythm Abdomen:  Soft, non tender, no hepatosplenomegaly Pelvic:  External genitalia is normal in appearance, no lesions.  The vagina is pale, +cystocele, and pelvic relaxation. Urethra has no lesions or masses. The cervix and uterus are absent.  No adnexal masses or tenderness noted.Bladder is non tender, no masses felt. Rectal: Good sphincter tone, no polyps, or hemorrhoids felt.  Hemoccult negative.+rectocele  Extremities/musculoskeletal:  No swelling or varicosities noted, no clubbing or cyanosis Psych:  No mood changes, alert and cooperative,seems happy AA is 0 Fall risk is low    03/16/2023    9:05 AM 08/10/2020   10:07 AM 07/18/2016    3:38 PM   Depression screen PHQ 2/9  Decreased Interest 0 0 0  Down, Depressed, Hopeless 0 0 0  PHQ - 2 Score 0 0 0  Altered sleeping 1 1   Tired, decreased energy 0 0   Change in appetite 0 0   Feeling bad or failure about yourself  0 0   Trouble concentrating 0 0   Moving slowly or fidgety/restless 0 0   Suicidal thoughts 0 0   PHQ-9 Score 1 1        03/16/2023    9:05 AM 08/10/2020   10:07 AM  GAD 7 : Generalized Anxiety Score  Nervous, Anxious, on Edge 0 0  Control/stop worrying 0 0  Worry too much - different things 0 0  Trouble relaxing 0 1  Restless 0 0  Easily annoyed or irritable 0 0  Afraid - awful might happen 0 0  Total GAD 7 Score 0 1    Upstream - 03/16/23 0911       Pregnancy Intention Screening   Does the patient want to become pregnant in the next year? N/A    Does the patient's partner want to become pregnant in the next year? N/A    Would the patient like to discuss contraceptive options today? N/A      Contraception Wrap Up   Current Method Female Sterilization   hyst   End Method Female Sterilization   hyst   Contraception Counseling Provided No              Examination chaperoned by Malachy Mood LPN   Impression  and Plan: 1. Urinary frequency +UF will send urine for UA C&S to rule out UTI - POCT Urinalysis Dipstick - Urinalysis, Routine w reflex microscopic - Urine Culture  2. Encounter for well woman exam with routine gynecological exam Physical in 1 year Labs with PCP Colonoscopy per GI Mammogram was negative 02/01/23  3. S/P hysterectomy  4. Pelvic floor relaxation  5. Cystocele with rectocele Discussed pessary as option she thinks had bladder tacked in the past  6. Encounter for screening fecal occult blood testing Hemoccult was negative

## 2023-03-16 NOTE — Telephone Encounter (Signed)
  Procedure: Colonoscopy  Estimated body mass index is 37.37 kg/m as calculated from the following:   Height as of an earlier encounter on 03/16/23: 4\' 11"  (1.499 m).   Weight as of an earlier encounter on 03/16/23: 185 lb (83.9 kg).  Have you had a colonoscopy before?  01/20/16, Dr. Karilyn Cota  Do you have family history of colon cancer?  no  Do you have a family history of polyps? no  Previous colonoscopy with polyps removed? no  Do you have a history colorectal cancer?   no  Are you diabetic?  no  Do you have a prosthetic or mechanical heart valve? no  Do you have a pacemaker/defibrillator?   no  Have you had endocarditis/atrial fibrillation?  no  Do you use supplemental oxygen/CPAP?  no  Have you had joint replacement within the last 12 months?  no  Do you tend to be constipated or have to use laxatives?  no   Do you have history of alcohol use? If yes, how much and how often.  no  Do you have history or are you using drugs? If yes, what do are you  using?  no  Have you ever had a stroke/heart attack?  no  Have you ever had a heart or other vascular stent placed,?no  Do you take weight loss medication? no  female patients,: have you had a hysterectomy? yes                              are you post menopausal?  no                              do you still have your menstrual cycle?     Date of last menstrual period?   Do you take any blood-thinning medications such as: (Plavix, aspirin, Coumadin, Aggrenox, Brilinta, Xarelto, Eliquis, Pradaxa, Savaysa or Effient)? no  If yes we need the name, milligram, dosage and who is prescribing doctor:               Current Outpatient Medications  Medication Sig Dispense Refill   Ascorbic Acid (VITAMIN C PO) Take by mouth.     cetirizine (ZYRTEC ALLERGY) 10 MG tablet Take 1 tablet (10 mg total) by mouth daily.     ELDERBERRY PO Take by mouth.     levocetirizine (XYZAL) 5 MG tablet Take 1 tablet (5 mg total) by mouth every  evening. 30 tablet 0   levothyroxine (SYNTHROID, LEVOTHROID) 200 MCG tablet Take 200 mcg by mouth daily before breakfast.     Multiple Vitamin (MULTIVITAMIN) capsule Take 1 capsule by mouth daily.     Probiotic Product (PROBIOTIC PO) Take by mouth.     No current facility-administered medications for this visit.    Allergies  Allergen Reactions   Hydrocodone Itching   Ampicillin Rash

## 2023-03-16 NOTE — Telephone Encounter (Signed)
Questionnaire in review

## 2023-03-17 LAB — MICROSCOPIC EXAMINATION
Bacteria, UA: NONE SEEN
Casts: NONE SEEN /[LPF]
RBC, Urine: NONE SEEN /[HPF] (ref 0–2)

## 2023-03-17 LAB — URINALYSIS, ROUTINE W REFLEX MICROSCOPIC
Bilirubin, UA: NEGATIVE
Glucose, UA: NEGATIVE
Ketones, UA: NEGATIVE
Nitrite, UA: NEGATIVE
Protein,UA: NEGATIVE
RBC, UA: NEGATIVE
Specific Gravity, UA: 1.021 (ref 1.005–1.030)
Urobilinogen, Ur: 0.2 mg/dL (ref 0.2–1.0)
pH, UA: 5.5 (ref 5.0–7.5)

## 2023-03-18 LAB — URINE CULTURE

## 2023-03-20 ENCOUNTER — Telehealth: Payer: Self-pay | Admitting: *Deleted

## 2023-03-20 NOTE — Telephone Encounter (Signed)
-----   Message from Cyril Mourning sent at 03/20/2023 10:26 AM EDT ----- Let her know no growth on urine culture [facility]

## 2023-03-20 NOTE — Telephone Encounter (Signed)
Pt aware urine culture showed no growth. Pt voiced understanding. JSY

## 2023-04-05 NOTE — Telephone Encounter (Signed)
Ok to schedule. ASA 2. Due for 7 year surveillance colonoscopy.

## 2023-04-06 NOTE — Telephone Encounter (Signed)
Attempted to call pt, VM not set up 

## 2023-04-11 ENCOUNTER — Other Ambulatory Visit (INDEPENDENT_AMBULATORY_CARE_PROVIDER_SITE_OTHER): Payer: 59

## 2023-04-11 DIAGNOSIS — R3915 Urgency of urination: Secondary | ICD-10-CM | POA: Diagnosis not present

## 2023-04-11 DIAGNOSIS — R350 Frequency of micturition: Secondary | ICD-10-CM | POA: Diagnosis not present

## 2023-04-11 LAB — POCT URINALYSIS DIPSTICK OB
Glucose, UA: NEGATIVE
Ketones, UA: NEGATIVE
Nitrite, UA: NEGATIVE
POC,PROTEIN,UA: NEGATIVE

## 2023-04-11 NOTE — Telephone Encounter (Signed)
I emailed this questionnaire to you. Looks like it was supposed to have went to the [employer]. Thank you

## 2023-04-11 NOTE — Progress Notes (Signed)
   NURSE VISIT- UTI SYMPTOMS   SUBJECTIVE:  Gwendolyn Sweeney is a 62 y.o. [phone] female here for UTI symptoms. She is a GYN patient. She reports chills, urinary frequency, and urinary urgency and her thighs are tired..  OBJECTIVE:  There were no vitals taken for this visit.  Appears well, in no apparent distress  Results for orders placed or performed in visit on 04/11/23 (from the past 24 hour(s))  POC Urinalysis Dipstick OB   Collection Time: 04/11/23 12:02 PM  Result Value Ref Range   Color, UA     Clarity, UA     Glucose, UA Negative Negative   Bilirubin, UA     Ketones, UA Negatie    Spec Grav, UA     Blood, UA 1+    pH, UA     POC,PROTEIN,UA Negative Negative, Trace, Small (1+), Moderate (2+), Large (3+), 4+   Urobilinogen, UA     Nitrite, UA Negative    Leukocytes, UA Trace (A) Negative   Appearance     Odor      ASSESSMENT: GYN patient with UTI symptoms and negative nitrites  PLAN: Discussed with Cyril Mourning, AGNP   Rx sent by provider today: No Urine culture sent Call or return to clinic prn if these symptoms worsen or fail to improve as anticipated. Follow-up: as needed   Caralyn Guile  04/11/2023 12:03 PM

## 2023-04-11 NOTE — Telephone Encounter (Signed)
Forwarded to Tanya.

## 2023-04-11 NOTE — Telephone Encounter (Signed)
Attempted to call pt, vm not set up  

## 2023-04-12 LAB — URINALYSIS, ROUTINE W REFLEX MICROSCOPIC
Bilirubin, UA: NEGATIVE
Glucose, UA: NEGATIVE
Ketones, UA: NEGATIVE
Nitrite, UA: NEGATIVE
Protein,UA: NEGATIVE
RBC, UA: NEGATIVE
Specific Gravity, UA: 1.028 (ref 1.005–1.030)
Urobilinogen, Ur: 0.2 mg/dL (ref 0.2–1.0)
pH, UA: 5.5 (ref 5.0–7.5)

## 2023-04-12 LAB — MICROSCOPIC EXAMINATION
Bacteria, UA: NONE SEEN
Casts: NONE SEEN /[LPF]
RBC, Urine: NONE SEEN /[HPF] (ref 0–2)

## 2023-04-13 ENCOUNTER — Telehealth: Payer: Self-pay | Admitting: *Deleted

## 2023-04-13 LAB — URINE CULTURE

## 2023-04-13 NOTE — Telephone Encounter (Signed)
Voice mail not set up @ 1:47 pm. JSY

## 2023-04-13 NOTE — Telephone Encounter (Signed)
-----   Message from Ransom sent at 04/13/2023  1:17 PM EST ----- Let Lorianna know no growth on urine

## 2023-04-14 NOTE — Telephone Encounter (Signed)
Voice mail not set up @ 12:40 pm. Pt was at lunch @ 12:40 pm. JSY

## 2023-04-17 NOTE — Telephone Encounter (Signed)
Left message @ 8:41 am, letting pt know no growth on urine culture. JSY

## 2023-05-01 DIAGNOSIS — E782 Mixed hyperlipidemia: Secondary | ICD-10-CM | POA: Diagnosis not present

## 2023-05-01 DIAGNOSIS — Z0001 Encounter for general adult medical examination with abnormal findings: Secondary | ICD-10-CM | POA: Diagnosis not present

## 2023-05-01 DIAGNOSIS — I10 Essential (primary) hypertension: Secondary | ICD-10-CM | POA: Diagnosis not present

## 2024-01-08 ENCOUNTER — Telehealth: Payer: Self-pay | Admitting: Obstetrics & Gynecology

## 2024-01-08 NOTE — Telephone Encounter (Signed)
 No voice mail, if she calls back needs to see PCP for that

## 2024-01-08 NOTE — Telephone Encounter (Signed)
 Patient called wanting to know if you would call her in regards to giving her something to sleep

## 2024-02-23 ENCOUNTER — Other Ambulatory Visit (HOSPITAL_COMMUNITY): Payer: Self-pay | Admitting: Adult Health

## 2024-02-23 DIAGNOSIS — Z1231 Encounter for screening mammogram for malignant neoplasm of breast: Secondary | ICD-10-CM

## 2024-02-26 ENCOUNTER — Ambulatory Visit (HOSPITAL_COMMUNITY)
Admission: RE | Admit: 2024-02-26 | Discharge: 2024-02-26 | Disposition: A | Discharge status: Home / Self Care | Source: Ambulatory Visit | Attending: Adult Health | Admitting: Adult Health

## 2024-02-26 ENCOUNTER — Encounter (HOSPITAL_COMMUNITY): Payer: Self-pay

## 2024-02-26 DIAGNOSIS — Z1231 Encounter for screening mammogram for malignant neoplasm of breast: Secondary | ICD-10-CM | POA: Insufficient documentation

## 2024-02-28 ENCOUNTER — Ambulatory Visit: Payer: Self-pay | Admitting: Adult Health

## 2024-02-28 NOTE — Telephone Encounter (Signed)
 Pt aware mammogram was negative for malignancy. Advised next mammogram due in 1 year. Pt voiced understanding. JSY

## 2024-02-28 NOTE — Telephone Encounter (Signed)
-----   Message from Delon Lewis sent at 02/28/2024  9:29 AM EDT ----- Let her know about mammogram THX

## 2024-04-30 ENCOUNTER — Telehealth: Payer: Self-pay | Admitting: Adult Health

## 2024-04-30 MED ORDER — SULFAMETHOXAZOLE-TRIMETHOPRIM 800-160 MG PO TABS: 1.0000 | ORAL_TABLET | Freq: Two times a day (BID) | ORAL | 0 refills | Status: DC

## 2024-04-30 NOTE — Addendum Note (Signed)
 Addended by: Leeandre Nordling A on: 04/30/2024 04:31 PM   Modules accepted: Orders

## 2024-04-30 NOTE — Telephone Encounter (Addendum)
 Pt has low back pain, fatigue and leg pain. Pt states her UTI's start with leg pain. Thanks! JSY

## 2024-04-30 NOTE — Telephone Encounter (Signed)
 Pt thinks she may have a UTI. Please advise

## 2024-04-30 NOTE — Telephone Encounter (Signed)
Rx sent for septra ds

## 2024-05-13 DIAGNOSIS — E039 Hypothyroidism, unspecified: Secondary | ICD-10-CM | POA: Diagnosis not present

## 2024-05-13 DIAGNOSIS — N390 Urinary tract infection, site not specified: Secondary | ICD-10-CM | POA: Diagnosis not present

## 2024-06-17 ENCOUNTER — Ambulatory Visit: Payer: Self-pay

## 2024-06-25 ENCOUNTER — Other Ambulatory Visit: Payer: Self-pay

## 2024-06-25 ENCOUNTER — Inpatient Hospital Stay (HOSPITAL_BASED_OUTPATIENT_CLINIC_OR_DEPARTMENT_OTHER)
Admission: EM | Admit: 2024-06-25 | Discharge: 2024-07-01 | DRG: 392 | Disposition: A | Discharge status: Home Health | Attending: Internal Medicine | Admitting: Internal Medicine

## 2024-06-25 ENCOUNTER — Encounter (HOSPITAL_BASED_OUTPATIENT_CLINIC_OR_DEPARTMENT_OTHER): Payer: Self-pay

## 2024-06-25 ENCOUNTER — Emergency Department (HOSPITAL_BASED_OUTPATIENT_CLINIC_OR_DEPARTMENT_OTHER)

## 2024-06-25 DIAGNOSIS — E860 Dehydration: Secondary | ICD-10-CM | POA: Diagnosis not present

## 2024-06-25 DIAGNOSIS — N321 Vesicointestinal fistula: Secondary | ICD-10-CM | POA: Diagnosis present

## 2024-06-25 DIAGNOSIS — K5720 Diverticulitis of large intestine with perforation and abscess without bleeding: Secondary | ICD-10-CM | POA: Diagnosis not present

## 2024-06-25 DIAGNOSIS — E66811 Obesity, class 1: Secondary | ICD-10-CM | POA: Diagnosis present

## 2024-06-25 DIAGNOSIS — E8809 Other disorders of plasma-protein metabolism, not elsewhere classified: Secondary | ICD-10-CM | POA: Diagnosis present

## 2024-06-25 DIAGNOSIS — D75839 Thrombocytosis, unspecified: Secondary | ICD-10-CM | POA: Diagnosis present

## 2024-06-25 DIAGNOSIS — Z87440 Personal history of urinary (tract) infections: Secondary | ICD-10-CM

## 2024-06-25 DIAGNOSIS — Z1152 Encounter for screening for COVID-19: Secondary | ICD-10-CM

## 2024-06-25 DIAGNOSIS — A419 Sepsis, unspecified organism: Secondary | ICD-10-CM

## 2024-06-25 DIAGNOSIS — Z885 Allergy status to narcotic agent status: Secondary | ICD-10-CM

## 2024-06-25 DIAGNOSIS — E039 Hypothyroidism, unspecified: Secondary | ICD-10-CM | POA: Diagnosis not present

## 2024-06-25 DIAGNOSIS — D649 Anemia, unspecified: Secondary | ICD-10-CM | POA: Diagnosis present

## 2024-06-25 DIAGNOSIS — N179 Acute kidney failure, unspecified: Secondary | ICD-10-CM | POA: Diagnosis present

## 2024-06-25 DIAGNOSIS — Z8249 Family history of ischemic heart disease and other diseases of the circulatory system: Secondary | ICD-10-CM

## 2024-06-25 DIAGNOSIS — Z6833 Body mass index (BMI) 33.0-33.9, adult: Secondary | ICD-10-CM

## 2024-06-25 DIAGNOSIS — A499 Bacterial infection, unspecified: Secondary | ICD-10-CM

## 2024-06-25 DIAGNOSIS — Z90710 Acquired absence of both cervix and uterus: Secondary | ICD-10-CM

## 2024-06-25 DIAGNOSIS — K760 Fatty (change of) liver, not elsewhere classified: Secondary | ICD-10-CM | POA: Diagnosis present

## 2024-06-25 DIAGNOSIS — F32A Depression, unspecified: Secondary | ICD-10-CM | POA: Diagnosis present

## 2024-06-25 DIAGNOSIS — Z880 Allergy status to penicillin: Secondary | ICD-10-CM

## 2024-06-25 DIAGNOSIS — K651 Peritoneal abscess: Secondary | ICD-10-CM

## 2024-06-25 DIAGNOSIS — Z1623 Resistance to quinolones and fluoroquinolones: Secondary | ICD-10-CM | POA: Diagnosis present

## 2024-06-25 DIAGNOSIS — Z833 Family history of diabetes mellitus: Secondary | ICD-10-CM

## 2024-06-25 DIAGNOSIS — Z79890 Hormone replacement therapy (postmenopausal): Secondary | ICD-10-CM

## 2024-06-25 LAB — CBC WITH DIFFERENTIAL/PLATELET
Abs Immature Granulocytes: 0.09 K/uL — ABNORMAL HIGH (ref 0.00–0.07)
Basophils Absolute: 0 K/uL (ref 0.0–0.1)
Basophils Relative: 0 %
Eosinophils Absolute: 0.1 K/uL (ref 0.0–0.5)
Eosinophils Relative: 0 %
HCT: 37 % (ref 36.0–46.0)
Hemoglobin: 11.9 g/dL — ABNORMAL LOW (ref 12.0–15.0)
Immature Granulocytes: 1 %
Lymphocytes Relative: 9 %
Lymphs Abs: 1.5 K/uL (ref 0.7–4.0)
MCH: 27.8 pg (ref 26.0–34.0)
MCHC: 32.2 g/dL (ref 30.0–36.0)
MCV: 86.4 fL (ref 80.0–100.0)
Monocytes Absolute: 0.9 K/uL (ref 0.1–1.0)
Monocytes Relative: 5 %
Neutro Abs: 13.4 K/uL — ABNORMAL HIGH (ref 1.7–7.7)
Neutrophils Relative %: 85 %
Platelets: 546 K/uL — ABNORMAL HIGH (ref 150–400)
RBC: 4.28 MIL/uL (ref 3.87–5.11)
RDW: 14.1 % (ref 11.5–15.5)
WBC: 15.9 K/uL — ABNORMAL HIGH (ref 4.0–10.5)
nRBC: 0 % (ref 0.0–0.2)

## 2024-06-25 LAB — COMPREHENSIVE METABOLIC PANEL WITH GFR
ALT: 22 U/L (ref 0–44)
AST: 25 U/L (ref 15–41)
Albumin: 3.8 g/dL (ref 3.5–5.0)
Alkaline Phosphatase: 102 U/L (ref 38–126)
Anion gap: 14 (ref 5–15)
BUN: 21 mg/dL (ref 8–23)
CO2: 29 mmol/L (ref 22–32)
Calcium: 10.1 mg/dL (ref 8.9–10.3)
Chloride: 95 mmol/L — ABNORMAL LOW (ref 98–111)
Creatinine, Ser: 1.15 mg/dL — ABNORMAL HIGH (ref 0.44–1.00)
GFR, Estimated: 53 mL/min — ABNORMAL LOW
Glucose, Bld: 103 mg/dL — ABNORMAL HIGH (ref 70–99)
Potassium: 4.6 mmol/L (ref 3.5–5.1)
Sodium: 137 mmol/L (ref 135–145)
Total Bilirubin: 0.6 mg/dL (ref 0.0–1.2)
Total Protein: 9 g/dL — ABNORMAL HIGH (ref 6.5–8.1)

## 2024-06-25 LAB — URINALYSIS, W/ REFLEX TO CULTURE (INFECTION SUSPECTED)
Bacteria, UA: NONE SEEN
Bilirubin Urine: NEGATIVE
Glucose, UA: NEGATIVE mg/dL
Nitrite: NEGATIVE
Protein, ur: 100 mg/dL — AB
Specific Gravity, Urine: 1.029 (ref 1.005–1.030)
WBC, UA: >50 WBC/hpf (ref 0–5)
pH: 5.5 (ref 5.0–8.0)

## 2024-06-25 LAB — LACTIC ACID, PLASMA: Lactic Acid, Venous: 1.2 mmol/L (ref 0.5–1.9)

## 2024-06-25 LAB — RESP PANEL BY RT-PCR (RSV, FLU A&B, COVID)  RVPGX2
Influenza A by PCR: NEGATIVE
Influenza B by PCR: NEGATIVE
Resp Syncytial Virus by PCR: NEGATIVE
SARS Coronavirus 2 by RT PCR: NEGATIVE

## 2024-06-25 LAB — PROTIME-INR
INR: 1.2 (ref 0.8–1.2)
Prothrombin Time: 15.8 s — ABNORMAL HIGH (ref 11.4–15.2)

## 2024-06-25 MED ORDER — LACTATED RINGERS IV BOLUS: 1000.0000 mL | Freq: Once | INTRAVENOUS | Status: DC

## 2024-06-25 MED ORDER — METRONIDAZOLE 500 MG/100ML IV SOLN
500.0000 mg | Freq: Two times a day (BID) | INTRAVENOUS | Status: DC
  Administered 2024-06-26 – 2024-07-01 (×11): 500 mg via INTRAVENOUS
  Filled 2024-06-25 (×11): qty 100

## 2024-06-25 MED ORDER — IOHEXOL 300 MG/ML  SOLN
100.0000 mL | Freq: Once | INTRAMUSCULAR | Status: AC | PRN
  Administered 2024-06-25: 100 mL via INTRAVENOUS

## 2024-06-25 MED ORDER — LACTATED RINGERS IV BOLUS
1000.0000 mL | Freq: Once | INTRAVENOUS | Status: AC
  Administered 2024-06-25: 1000 mL via INTRAVENOUS

## 2024-06-25 MED ORDER — SODIUM CHLORIDE 0.9 % IV SOLN: 2.0000 g | Freq: Once | INTRAVENOUS | Status: DC

## 2024-06-25 MED ORDER — METRONIDAZOLE 500 MG/100ML IV SOLN
500.0000 mg | Freq: Once | INTRAVENOUS | Status: AC
  Administered 2024-06-25: 500 mg via INTRAVENOUS
  Filled 2024-06-25: qty 100

## 2024-06-25 MED ORDER — SODIUM CHLORIDE 0.9 % IV SOLN
2.0000 g | Freq: Once | INTRAVENOUS | Status: AC
  Administered 2024-06-25: 2 g via INTRAVENOUS
  Filled 2024-06-25: qty 12.5

## 2024-06-25 NOTE — Subjective & Objective (Signed)
 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 Rubin is aware Patient was given a dose of cefepime  and Flagyl  And 2 L of lactic "Ringer's" 

## 2024-06-25 NOTE — ED Notes (Signed)
Report attempted x 1- no answer 

## 2024-06-25 NOTE — ED Triage Notes (Signed)
 Presents to ED with c/o fever and lower abdominal pain. Pt states she's had fevers for three weeks daily. Had flu B and recent UTI. UTI symptoms did not fully resolve with antibiotics. Went to f/u appt today and referred to ED for further evaluation.

## 2024-06-25 NOTE — ED Notes (Signed)
 [facility] at bedside

## 2024-06-25 NOTE — ED Provider Notes (Signed)
 " [facility] Provider Note   CSN: [id number] Arrival date & time: 06/25/24  1636     Patient presents with: Fever   Gwendolyn Sweeney is a 64 y.o. female.  Fever Patient is a 64 year old female with previous medical history of diverticulosis, fatigue, thyroid  disease, UTIs, cystocele, rectocele presenting ED today for persistent dysuria, intermittent fevers, cough, fatigue x 3 weeks.  Noted to have been diagnosed 2 weeks ago with influenza B as well as with UTI.  Having urine culture done at that time and treat with Macrobid  which it was sensitive to.  However was seen again by PCP today and was told to come to ED for evaluation due to having some mild left lower quadrant abdominal pain with persistent fever and dysuria despite antibiotic treatment.   Denies headache, vision changes, vertigo, tinnitus, otalgia, odynophagia, dysphagia, chest pain, shortness of breath, congestion, nausea, vomiting, diarrhea, melena, hematochezia, hematuria, lower leg swelling, rashes.  Prior to Admission medications  Medication Sig Start Date End Date Taking? Authorizing Provider  Ascorbic Acid (VITAMIN C PO) Take by mouth.    [provider]  cetirizine  (ZYRTEC  ALLERGY) 10 MG tablet Take 1 tablet (10 mg total) by mouth daily. 01/01/19   Signa Delon LABOR, NP  ELDERBERRY PO Take by mouth. Patient not taking: Reported on 04/11/2023    [provider]  levocetirizine (XYZAL ) 5 MG tablet Take 1 tablet (5 mg total) by mouth every evening. 05/29/22   Leath-Warren, Etta PARAS, NP  levothyroxine  (SYNTHROID , LEVOTHROID) 200 MCG tablet Take 200 mcg by mouth daily before breakfast.    [provider]  Multiple Vitamin (MULTIVITAMIN) capsule Take 1 capsule by mouth daily.    [provider]  Probiotic Product (PROBIOTIC PO) Take by mouth. Patient not taking: Reported on 04/11/2023    [provider]  sulfamethoxazole -trimethoprim   (BACTRIM  DS) 800-160 MG tablet Take 1 tablet by mouth 2 (two) times daily. Take 1 bid 04/30/24   Signa Delon LABOR, NP    Allergies: Hydrocodone and Ampicillin     Review of Systems  Constitutional:  Positive for fever.    Updated Vital Signs BP 125/68   Pulse 91   Temp 100.1 F (37.8 C) (Oral)   Resp 18   Ht 4' 11 (1.499 m)   Wt 74.8 kg   SpO2 95%   BMI 33.33 kg/m   Physical Exam  (all labs ordered are listed, but only abnormal results are displayed) Labs Reviewed  COMPREHENSIVE METABOLIC PANEL WITH GFR - Abnormal; Notable for the following components:      Result Value   Chloride 95 (*)    Glucose, Bld 103 (*)    Creatinine, Ser 1.15 (*)    Total Protein 9.0 (*)    GFR, Estimated 53 (*)    All other components within normal limits  CBC WITH DIFFERENTIAL/PLATELET - Abnormal; Notable for the following components:   WBC 15.9 (*)    Hemoglobin 11.9 (*)    Platelets 546 (*)    Neutro Abs 13.4 (*)    Abs Immature Granulocytes 0.09 (*)    All other components within normal limits  URINALYSIS, W/ REFLEX TO CULTURE (INFECTION SUSPECTED) - Abnormal; Notable for the following components:   APPearance CLOUDY (*)    Hgb urine dipstick MODERATE (*)    Ketones, ur TRACE (*)    Protein, ur 100 (*)    Leukocytes,Ua LARGE (*)    All other components within normal limits  RESP PANEL BY RT-PCR (RSV, FLU A&B, COVID)  RVPGX2  URINE CULTURE  CULTURE, BLOOD (ROUTINE X 2)  CULTURE, BLOOD (ROUTINE X 2)  LACTIC ACID, PLASMA  LACTIC ACID, PLASMA  PROTIME-INR    EKG: None  Radiology: CT CHEST ABDOMEN PELVIS W CONTRAST Result Date: 06/25/2024 EXAM: CT CHEST WITH CONTRAST 06/25/2024 07:11:57 PM TECHNIQUE: CT of the chest was performed with the administration of intravenous contrast. Multiplanar reformatted images are provided for review. Automated exposure control, iterative reconstruction, and/or weight based adjustment of the mA/kV was utilized to reduce the radiation dose to as  low as reasonably achievable. COMPARISON: CT chest 02/01/2022 and CT abdomen and pelvis 05/03/2000. CLINICAL HISTORY: FINDINGS: MEDIASTINUM: Heart and pericardium are unremarkable. The central airways are clear. LYMPH NODES: No mediastinal, hilar or axillary lymphadenopathy. LUNGS AND PLEURA: There is a linear band of atelectasis or scarring in the left lower lobe. The lungs are otherwise clear. No pleural effusion or pneumothorax. SOFT TISSUES/BONES: No acute abnormality of the bones or soft tissues. UPPER ABDOMEN: There is wall thickening of the dome of the liver. There is a small amount of air in the bladder. Cannot exclude fistulous connection to sigmoid colon on sagittal image 5/59. There is wall thickening and inflammatory stranding surrounding the sigmoid colon. There is scattered colonic diverticula. There is an adjacent multiloculated enhancing air fluid collection in the left lower quadrant abutting the sigmoid colon measuring 6.9 x 5.3 x 6.4 cm. There is a small amount of microperforation seen on image 2/97. The appendix appears normal. The uterus is surgically absent. Ovaries are not well delineated on this study. IMPRESSION: 1. Acute sigmoid diverticulitis with microperforation and adjacent multiloculated left lower quadrant abscess measuring 6.9 x 5.3 x 6.4 cm. 2. Intraluminal bladder gas and bladder wall thickening superiorly worrisome for reactive cystitis. There is a questionable colovesicular fistula. 3. Note is made that the left ovary is not well seen and therefore, tubo-ovarian abscess would also be in the differential. Electronically signed by: Greig Pique MD 06/25/2024 07:24 PM EST RP Workstation: [id number]    .Critical Care  Performed by: Beola Terrall RAMAN, PA-C Authorized by: Beola Terrall RAMAN, PA-C   Critical care provider statement:    Critical care time (minutes):  39   Critical care was necessary to treat or prevent imminent or life-threatening deterioration of the following  conditions:  Sepsis   Critical care was time spent personally by me on the following activities:  Development of treatment plan with patient or surrogate, discussions with consultants, evaluation of patient's response to treatment, examination of patient, ordering and review of laboratory studies, ordering and review of radiographic studies, ordering and performing treatments and interventions, pulse oximetry, re-evaluation of patient's condition, review of old charts and obtaining history from patient or surrogate   Care discussed with: admitting provider      Medications Ordered in the ED  ceFEPIme  (MAXIPIME ) 2 g in sodium chloride  0.9 % 100 mL IVPB (0 g Intravenous Stopped 06/25/24 2101)    And  metroNIDAZOLE  (FLAGYL ) IVPB 500 mg (has no administration in time range)  lactated ringers  bolus 1,000 mL (has no administration in time range)  lactated ringers  bolus 1,000 mL (0 mLs Intravenous Stopped 06/25/24 2012)  iohexol  (OMNIPAQUE ) 300 MG/ML solution 100 mL (100 mLs Intravenous Contrast Given 06/25/24 1905)    Clinical Course as of 06/25/24 2126  Tue Jun 25, 2024  2034 Spoke with general surgery, Dr. Rubin who noted that they will consult on her and that she  should be medical admit at this time. [CB]    Clinical Course User Index [CB] Beola Terrall RAMAN, PA-C   Medical Decision Making Amount and/or Complexity of Data Reviewed Labs: ordered. Radiology: ordered.  Risk Prescription drug management.  This patient is a  64 year old female who presents to the ED for concern of fatigue, dysuria x 3 weeks accompanied with some mild left lower quadrant abdominal pain.  Noted to have been recently treated for UTI with Macrobid  for which UTI was sensitive for on urine culture but has had only mild improvement in symptoms until recurrence 3 days ago.  Where she notes now that her left lower quadrant, pain is a little more pronounced.  On physical exam, patient is in no acute distress, afebrile  (borderline febrile with temp of 100.1 F.), alert and orient x 4, speaking in full sentences, nontachypneic.  Additionally noting her to be tachycardic with heart rate of 120s.  LCTAB, no CVA tenderness.  Does have some left lower quad abdominal tenderness to palpation.  With no guarding.  No lower leg extremity swelling.  With patient's current presentation, suspecting possible UTI versus viral syndrome versus intra-abdominal abnormality.  SIRS positive but does not meet sepsis criteria at this time.  Providing fluids containing lactic, blood cultures, urine cultures.  CT scan did note diverticula with microperforations and abscess formation and possible fistula formation, at that time antibiotics were started and reached out to Dr. Rubin of general surgery who noted that she should be admitted to medicine and that they will consult on her whenever she arrives.  Spoke with Dr. Franky with hospitalist who accepted patient care at this time.  Differential diagnoses prior to evaluation: The emergent differential diagnosis includes, but is not limited to, gastroenteritis, diverticulitis, pancreatitis, cholecystitis, abscess, UTI, STI, pyelonephritis,. This is not an exhaustive differential.   Past Medical History / Co-morbidities / Social History: Diverticulosis, thyroid  disease, rectocele, cystocele  Additional history: Chart reviewed. Pertinent results include:   Seen by primary care, equal on 06/13/2024 for flulike symptoms.  Diagnosed with influenza B and provided Tamiflu.  Noted to have had urine culture on 06/18/2023, treated with Macrobid  and seen to be sensitive to Macrobid .  Lab Tests/Imaging studies: I personally interpreted labs/imaging and the pertinent results include: CBC notes an elevated white count 15.9 and decreased hemoglobin 11.9 and elevated platelets of 546 CMP notes an elevated creatinine 1.15 and decreased GFR 53, decreased from previous Lactic acid 1.2 Blood cultures  pending, urine culture pending,   respiratory panel negative   CT chest abdomen pelvis with contrast showed acute sigmoid diverticulitis with microperforations and left lower quadrant abscess measuring 6.9 x 5.3 x 6.4 cm noted to additionally have intraluminal bladder gas and bowel wall thickening, with possible fistula formation noting that left ovary was not well-visualized.   I agree with the radiologist interpretation.  Cardiac monitoring: EKG obtained and interpreted by myself and attending physician which shows:    Medications: I ordered medication including cefepime , LR, Flagyl .  I have reviewed the patients home medicines and have made adjustments as needed.  Critical Interventions: Antibiotics for sepsis with consults to general surgery  Social Determinants of Health: Has good follow-up with PCP  Disposition: After consideration of the diagnostic results and the patients response to treatment, I feel that the patient would benefit from admission, patient care transferred over to Dr. Franky   Final diagnoses:  Diverticulitis of large intestine with perforation, unspecified bleeding status  Left lower quadrant abdominal abscess (HCC)  Sepsis, due to unspecified organism, unspecified whether acute organ dysfunction present [facility])    ED Discharge Orders     None          Teddi Badalamenti S, PA-C 06/25/24 2127    Kingsley, Victoria K, DO 06/25/24 2321  "

## 2024-06-25 NOTE — H&P (Signed)
 "    [name] FMW:[id number] DOB: 1961-02-15 DOA: 06/25/2024     PCP: [name], MD      Patient arrived to ER on 06/25/24 at 1636 Referred by Attending Franky Redia SAILOR, MD  Patient coming from:    home Lives  With family      Chief Complaint:   Chief Complaint  Patient presents with   Fever    HPI: Gwendolyn Sweeney is a 64 y.o. female with medical history significant of cystocele/rectocele frequent UTIs, hypothyroidism   Presented with   abdominal pain and fevers Patient presented to ER with fever and abd pain for 3 weeks This was attributed to influenza B and UTI She Continues to have some dysuria and ongoing intermittent fevers as well as cough She was treated with Macrobid  for her UTI Continued to have left lower quadrant abdominal pain despite antibiotics  Patient was seen [facility] scan showed diverticular microperforation and possible fistula formation General Surgery was consulted Dr. Rubin is aware Patient was given a dose of cefepime  and Flagyl  And 2 L of lactic "Ringer's"     No prior hx of diverticulitis Hx of hysterectomy and hernia repair Dysuria now resolved Last fever was this afternoon 101.3 The cough has improve just a bit of dry cough No black stool no blood in stool Has had some blood in urine No CP , no SOb  Denies significant ETOH intake  Does not smoke    Regarding pertinent Chronic problems:     Hypothyroidism:   Lab Results  Component Value Date   TSH 3.520 03/23/2015   on synthroid     obesity-   BMI Readings from Last 1 Encounters:  06/25/24 33.33 kg/m    Chronic anemia - baseline hg Hemoglobin & Hematocrit  Recent Labs    06/25/24 1650  HGB 11.9*   Iron/TIBC/Ferritin/ %Sat No results found for: IRON, TIBC, FERRITIN, IRONPCTSAT    While in ER: Clinical Course as of 06/25/24 2349  Tue Jun 25, 2024  2034 Spoke with general surgery, Dr. Rubin who noted that they will consult on her and that  she should be medical admit at this time. [CB]    Clinical Course User Index [CB] Beola Terrall GORMAN, PA-C       Lab Orders         Urine Culture         Resp panel by RT-PCR (RSV, Flu A&B, Covid) Anterior Nasal Swab         Blood culture (routine x 2)         Lactic acid, plasma         Comprehensive metabolic panel         CBC with Differential         Urinalysis, w/ Reflex to Culture (Infection Suspected) -Urine, Clean Catch         Protime-INR       CTabd/pelvis chest- Acute sigmoid diverticulitis with microperforation and adjacent multiloculated left lower quadrant abscess measuring 6.9 x 5.3 x 6.4 cm. 2. Intraluminal bladder gas and bladder wall thickening superiorly worrisome for reactive cystitis. There is a questionable colovesicular fistula. 3. Note is made that the left ovary is not well seen and therefore, tubo-ovarian abscess would also be in the differential.  Following Medications were ordered in ER: Medications  lactated ringers  bolus 1,000 mL (has no administration in time range)  lactated ringers  bolus 1,000 mL (0 mLs Intravenous Stopped 06/25/24 2012)  iohexol  (OMNIPAQUE )  300 MG/ML solution 100 mL (100 mLs Intravenous Contrast Given 06/25/24 1905)  ceFEPIme  (MAXIPIME ) 2 g in sodium chloride  0.9 % 100 mL IVPB (0 g Intravenous Stopped 06/25/24 2101)    And  metroNIDAZOLE  (FLAGYL ) IVPB 500 mg (500 mg Intravenous Transfusing/Transfer 06/25/24 2239)    _______________________________________________________ ER Provider Called:    General Surgery Dr. Rubin They Recommend admit to medicine   Will see in AM    ED Triage Vitals  Encounter Vitals Group     BP 06/25/24 1642 126/87     Girls Systolic BP Percentile --      Girls Diastolic BP Percentile --      Boys Systolic BP Percentile --      Boys Diastolic BP Percentile --      Pulse Rate 06/25/24 1642 (!) 116     Resp 06/25/24 1642 20     Temp 06/25/24 1642 100.1 F (37.8 C)     Temp Source 06/25/24 1642 Oral      SpO2 06/25/24 1642 94 %     Weight 06/25/24 1646 165 lb (74.8 kg)     Height 06/25/24 1646 4' 11 (1.499 m)     Head Circumference --      Peak Flow --      Pain Score 06/25/24 1646 0     Pain Loc --      Pain Education --      Exclude from Growth Chart --   UFJK(75)@     _________________________________________ Significant initial  Findings: Abnormal Labs Reviewed  COMPREHENSIVE METABOLIC PANEL WITH GFR - Abnormal; Notable for the following components:      Result Value   Chloride 95 (*)    Glucose, Bld 103 (*)    Creatinine, Ser 1.15 (*)    Total Protein 9.0 (*)    GFR, Estimated 53 (*)    All other components within normal limits  CBC WITH DIFFERENTIAL/PLATELET - Abnormal; Notable for the following components:   WBC 15.9 (*)    Hemoglobin 11.9 (*)    Platelets 546 (*)    Neutro Abs 13.4 (*)    Abs Immature Granulocytes 0.09 (*)    All other components within normal limits  URINALYSIS, W/ REFLEX TO CULTURE (INFECTION SUSPECTED) - Abnormal; Notable for the following components:   APPearance CLOUDY (*)    Hgb urine dipstick MODERATE (*)    Ketones, ur TRACE (*)    Protein, ur 100 (*)    Leukocytes,Ua LARGE (*)    All other components within normal limits  PROTIME-INR - Abnormal; Notable for the following components:   Prothrombin Time 15.8 (*)    All other components within normal limits        ECG: Ordered Personally reviewed and interpreted by me showing: HR : 91 Rhythm:  NSR,   no evidence of ischemic changes QTC 425   The recent clinical data is shown below. Vitals:   06/25/24 2145 06/25/24 2200 06/25/24 2215 06/25/24 2319  BP: 127/69 (!) 111/94 122/67 135/70  Pulse: 89 94 (!) 101 94  Resp: 18 12 16 16   Temp:    99.8 F (37.7 C)  TempSrc:    Oral  SpO2: 92% 94% 92% 96%  Weight:      Height:        WBC     Component Value Date/Time   WBC 15.9 (H) 06/25/2024 1650   LYMPHSABS 1.5 06/25/2024 1650   MONOABS 0.9 06/25/2024 1650   EOSABS 0.1  06/25/2024  1650   BASOSABS 0.0 06/25/2024 1650    Lactic Acid, Venous    Component Value Date/Time   LATICACIDVEN 1.2 06/25/2024 1652       UA pyuria   Urine analysis:    Component Value Date/Time   COLORURINE YELLOW 06/25/2024 1650   APPEARANCEUR CLOUDY (A) 06/25/2024 1650   APPEARANCEUR Clear 04/11/2023 1515   LABSPEC 1.029 06/25/2024 1650   PHURINE 5.5 06/25/2024 1650   GLUCOSEU NEGATIVE 06/25/2024 1650   HGBUR MODERATE (A) 06/25/2024 1650   BILIRUBINUR NEGATIVE 06/25/2024 1650   BILIRUBINUR Negative 04/11/2023 1515   KETONESUR TRACE (A) 06/25/2024 1650   PROTEINUR 100 (A) 06/25/2024 1650   UROBILINOGEN 0.2 02/13/2023 0942   NITRITE NEGATIVE 06/25/2024 1650   LEUKOCYTESUR LARGE (A) 06/25/2024 1650    Results for orders placed or performed during the hospital encounter of 06/25/24  Blood culture (routine x 2)     Status: None (Preliminary result)   Collection Time: 06/25/24  6:40 PM   Specimen: BLOOD RIGHT HAND  Result Value Ref Range Status   Specimen Description   Final    BLOOD RIGHT HAND Performed at [facility], [address], [place], [place] [address]    Special Requests   Final    Blood Culture adequate volume BOTTLES DRAWN AEROBIC AND ANAEROBIC Performed at [facility], [address], [place], [place] [address]    Culture PENDING  Incomplete   Report Status PENDING  Incomplete  Resp panel by RT-PCR (RSV, Flu A&B, Covid) Anterior Nasal Swab     Status: None   Collection Time: 06/25/24  7:01 PM   Specimen: Anterior Nasal Swab  Result Value Ref Range Status   SARS Coronavirus 2 by RT PCR NEGATIVE NEGATIVE Final         Influenza A by PCR NEGATIVE NEGATIVE Final   Influenza B by PCR NEGATIVE NEGATIVE Final         Resp Syncytial Virus by PCR NEGATIVE NEGATIVE Final          ABX started Antibiotics Given (last 72 hours)     Date/Time Action Medication Dose Rate   06/25/24 2031 New Bag/Given   ceFEPIme  (MAXIPIME ) 2 g in  sodium chloride  0.9 % 100 mL IVPB 2 g 200 mL/hr   06/25/24 2141 New Bag/Given   metroNIDAZOLE  (FLAGYL ) IVPB 500 mg 500 mg 100 mL/hr         __________________________________________________________ Recent Labs  Lab 06/25/24 1650  NA 137  K 4.6  CO2 29  GLUCOSE 103*  BUN 21  CREATININE 1.15*  CALCIUM 10.1    Cr   Up from baseline see below Lab Results  Component Value Date   CREATININE 1.15 (H) 06/25/2024   CREATININE 0.97 03/23/2015   CREATININE 1.03 04/08/2013    Recent Labs  Lab 06/25/24 1650  AST 25  ALT 22  ALKPHOS 102  BILITOT 0.6  PROT 9.0*  ALBUMIN 3.8   Lab Results  Component Value Date   CALCIUM 10.1 06/25/2024    Plt: Lab Results  Component Value Date   PLT 546 (H) 06/25/2024       Recent Labs  Lab 06/25/24 1650  WBC 15.9*  NEUTROABS 13.4*  HGB 11.9*  HCT 37.0  MCV 86.4  PLT 546*    HG/HCT      Component Value Date/Time   HGB 11.9 (L) 06/25/2024 1650   HGB 13.4 03/23/2015 0922   HCT 37.0 06/25/2024 1650   HCT 40.8 03/23/2015  9077   MCV 86.4 06/25/2024 1650   MCV 87 03/23/2015 0922     _______________________________________________ Hospitalist was called for admission for   Diverticulitis of large intestine with perforation, unspecified bleeding status  Left lower quadrant abdominal abscess     The following Work up has been ordered so far:  Orders Placed This Encounter  Procedures   Critical Care   Urine Culture   Resp panel by RT-PCR (RSV, Flu A&B, Covid) Anterior Nasal Swab   Blood culture (routine x 2)   CT CHEST ABDOMEN PELVIS W CONTRAST   Lactic acid, plasma   Comprehensive metabolic panel   CBC with Differential   Urinalysis, w/ Reflex to Culture (Infection Suspected) -Urine, Clean Catch   Protime-INR   Diet NPO time specified   Consult to general surgery   Consult to hospitalist   EKG 12-Lead   EKG 12-Lead   Admit to Inpatient (patient's expected length of stay will be greater than 2 midnights or inpatient  only procedure)     OTHER Significant initial  Findings:  labs showing:     DM  labs:  HbA1C: No results for input(s): HGBA1C in the last 8760 hours.     CBG (last 3)  No results for input(s): GLUCAP in the last 72 hours.        Cultures:    Component Value Date/Time   SDES  06/25/2024 1840    BLOOD RIGHT HAND Performed at [facility], [address] [place], [place] [address]    [facility]  06/25/2024 1840    Blood Culture adequate volume BOTTLES DRAWN AEROBIC AND ANAEROBIC Performed at [facility], [address], [place], [place] [address]    CULT PENDING 06/25/2024 1840   REPTSTATUS PENDING 06/25/2024 1840     Radiological Exams on Admission: CT CHEST ABDOMEN PELVIS W CONTRAST Result Date: 06/25/2024 EXAM: CT CHEST WITH CONTRAST 06/25/2024 07:11:57 PM TECHNIQUE: CT of the chest was performed with the administration of intravenous contrast. Multiplanar reformatted images are provided for review. Automated exposure control, iterative reconstruction, and/or weight based adjustment of the mA/kV was utilized to reduce the radiation dose to as low as reasonably achievable. COMPARISON: CT chest 02/01/2022 and CT abdomen and pelvis 05/03/2000. CLINICAL HISTORY: FINDINGS: MEDIASTINUM: Heart and pericardium are unremarkable. The central airways are clear. LYMPH NODES: No mediastinal, hilar or axillary lymphadenopathy. LUNGS AND PLEURA: There is a linear band of atelectasis or scarring in the left lower lobe. The lungs are otherwise clear. No pleural effusion or pneumothorax. SOFT TISSUES/BONES: No acute abnormality of the bones or soft tissues. UPPER ABDOMEN: There is wall thickening of the dome of the liver. There is a small amount of air in the bladder. Cannot exclude fistulous connection to sigmoid colon on sagittal image 5/59. There is wall thickening and inflammatory stranding surrounding the sigmoid colon. There is scattered colonic diverticula.  There is an adjacent multiloculated enhancing air fluid collection in the left lower quadrant abutting the sigmoid colon measuring 6.9 x 5.3 x 6.4 cm. There is a small amount of microperforation seen on image 2/97. The appendix appears normal. The uterus is surgically absent. Ovaries are not well delineated on this study. IMPRESSION: 1. Acute sigmoid diverticulitis with microperforation and adjacent multiloculated left lower quadrant abscess measuring 6.9 x 5.3 x 6.4 cm. 2. Intraluminal bladder gas and bladder wall thickening superiorly worrisome for reactive cystitis. There is a questionable colovesicular fistula. 3. Note is made that the left ovary is not well seen  and therefore, tubo-ovarian abscess would also be in the differential. Electronically signed by: Greig Pique MD 06/25/2024 07:24 PM EST RP Workstation: [id number]   _______________________________________________________________________________________________________ Latest  Blood pressure 135/70, pulse 94, temperature 99.8 F (37.7 C), temperature source Oral, resp. rate 16, height 4' 11 (1.499 m), weight 74.8 kg, SpO2 96%.   Vitals  labs and radiology finding personally reviewed  Review of Systems:    Pertinent positives include:   Fevers, chills, fatigue  Constitutional:  No weight loss, night sweats,, weight loss  HEENT:  No headaches, Difficulty swallowing,Tooth/dental problems,Sore throat,  No sneezing, itching, ear ache, nasal congestion, post nasal drip,  Cardio-vascular:  No chest pain, Orthopnea, PND, anasarca, dizziness, palpitations.no Bilateral lower extremity swelling  GI:  No heartburn, indigestion, abdominal pain, nausea, vomiting, diarrhea, change in bowel habits, loss of appetite, melena, blood in stool, hematemesis Resp:  no shortness of breath at rest. No dyspnea on exertion, No excess mucus, no productive cough, No non-productive cough, No coughing up of blood.No change in color of mucus.No wheezing. Skin:   no rash or lesions. No jaundice GU:  no dysuria, change in color of urine, no urgency or frequency. No straining to urinate.  No flank pain.  Musculoskeletal:  No joint pain or no joint swelling. No decreased range of motion. No back pain.  Psych:  No change in mood or affect. No depression or anxiety. No memory loss.  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 She has never used smokeless tobacco. She reports that she does not drink alcohol and does not use drugs.   Family History:   Family History  Problem Relation Age of Onset   Heart disease Mother    Heart disease Father    Diabetes Daughter    Diabetes Paternal Grandfather    Heart disease Paternal Grandfather    Macular degeneration Paternal Grandfather    Glaucoma Maternal Grandmother    Macular degeneration Sister    Other Other        paternal 2nd cousin-tested + for gene for breast cancer   ______________________________________________________________________________________________ Allergies: Allergies[1]   Prior to Admission medications   Medication Sig Start Date End Date Taking? Authorizing Provider  Ascorbic Acid (VITAMIN C PO) Take by mouth.    [provider]  cetirizine  (ZYRTEC  ALLERGY) 10 MG tablet Take 1 tablet (10 mg total) by mouth daily. 01/01/19   Signa Delon LABOR, NP  ELDERBERRY PO Take by mouth. Patient not taking: Reported on 04/11/2023    [provider]  levocetirizine (XYZAL ) 5 MG tablet Take 1 tablet (5  mg total) by mouth every evening. 05/29/22   Leath-Warren, Etta PARAS, NP  levothyroxine  (SYNTHROID , LEVOTHROID) 200 MCG tablet Take 200 mcg by mouth daily before breakfast.    [provider]  Multiple Vitamin (MULTIVITAMIN) capsule Take 1 capsule by mouth daily.    [provider]  Probiotic Product (PROBIOTIC PO) Take by mouth. Patient not taking: Reported on 04/11/2023    [provider]  sulfamethoxazole -trimethoprim  (BACTRIM  DS) 800-160 MG tablet Take 1 tablet by mouth 2 (two) times daily. Take 1 bid 04/30/24   Signa Delon LABOR, NP    ___________________________________________________________________________________________________ Physical Exam:    06/25/2024   11:19 PM 06/25/2024   10:15 PM 06/25/2024   10:00 PM  Vitals with BMI  Systolic 135 122 888  Diastolic 70 67 94  Pulse 94 101 94     1. General:  in No  Acute distress   Chronically ill   -appearing 2. Psychological: Alert and   Oriented 3. Head/ENT Dry Mucous Membranes                          Head Non traumatic, neck supple                          Normal   Dentition 4. SKIN decreased Skin turgor,  Skin clean Dry and intact no rash    5. Heart: Regular rate and rhythm no  Murmur, no Rub or gallop 6. Lungs:   no wheezes or crackles   7. Abdomen: Soft, LEFT lower quadrant tender, Non distended  obese bowel sounds present 8. Lower extremities: no clubbing, cyanosis, no edema 9. Neurologically Grossly intact, moving all 4 extremities equally   10. MSK: Normal range of motion     Chart has been reviewed  ______________________________________________________________________________________________  Assessment/Plan 64 y.o. female with medical history significant of cystocele/rectocele frequent UTIs, hypothyroidism  Admitted for   Diverticulitis of large intestine with perforation  Left lower quadrant abdominal abscess    Present on Admission:  Diverticulitis of colon with perforation  Colo-vesical fistula  Hypothyroidism  Anemia  Dehydration     Diverticulitis of colon with perforation Continue cefepime  and Flagyl  appreciate general surgery consult Given abscess may need IR consult to drain but defer to general surgery N.p.o. 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  Defer to general surgery for further evaluation Continue cefepime  and Flagyl  for now  Hypothyroidism Check TSH and restart Synthroid  when able to tolerate p.o.  Anemia Obtain anemia panel  Dehydration Noted mild AKI will rehydrate obtain electrolytes follow renal function    Other plan as per orders.  DVT prophylaxis:  SCD     Code Status:    Code Status: Not on file FULL CODE as per patient  I had personally discussed CODE STATUS with patient ACP   none  Family Communication:   Family at  Bedside  plan of care was discussed   with Husband,    Diet  Diet Orders (From admission, onward)     Start     Ordered   06/25/24 2019  Diet NPO time specified  Diet effective now        06/25/24 2018            Disposition Plan:      To home once workup is complete and patient is stable   Following barriers for discharge:  Pain controlled with PO medications                               Afebrile, white count improving able to transition to PO antibiotics                             Will need to be able to tolerate PO                                                        Will need consultants to evaluate patient prior to discharge      Consult Orders  (From admission, onward)           Start     Ordered   06/25/24 2037  Consult to hospitalist  Paged by Lyle, spoke to Luke  Once       Provider:  (Not yet assigned)  Question Answer Comment  Place call to: [facility]   Reason for Consult Admit      06/25/24 2036            Consults called: General Surgery  Admission status:  ED Disposition     ED Disposition  Admit   Condition  --   Comment  Hospital Area: [facility] [[id number]]  Level of Care: Med-Surg [16]  May admit patient to [facility] or [facility] if equivalent level of care is available:: Yes  Interfacility transfer: Yes  Diagnosis: Diverticulitis of colon with perforation [658807]  Admitting Physician: FRANKY REDIA SAILOR [[id number]]  Attending Physician: FRANKY REDIA SAILOR [phone]  Certification:: I certify this patient will need inpatient services for at least 2 midnights  Expected Medical Readiness: 06/27/2024            inpatient     I Expect 2 midnight stay secondary to severity of patient's current illness need for inpatient interventions justified by the following:     Severe lab/radiological/exam abnormalities including:    Diverticulitis of large intestine with perforation  Left lower quadrant abdominal abscess     That are currently affecting medical management.   I expect  patient to be hospitalized for 2 midnights requiring inpatient medical care.  Patient is at high risk for adverse outcome (such as loss of life or disability) if not treated.  Indication for inpatient stay as follows:   severe pain requiring acute inpatient management,  inability to maintain oral hydration   Need for operative/procedural  intervention    Need for IV antibiotics, IV fluids,, IV pain medications,    Level of care      medical floor     Aneira Cavitt 06/26/2024, 1:06 AM    [facility]     after 2 AM please page floor coverage   If 7AM-7PM, please contact the day team taking care of the patient using [email or web address]        [1]  Allergies Allergen Reactions   Hydrocodone Itching   Ampicillin  Rash   "

## 2024-06-25 NOTE — H&P (Incomplete)
 "    Gwendolyn Sweeney FMW:[id number] DOB: [birth year] DOA: 06/25/2024     PCP: [name], MD      Patient arrived to ER on 06/25/24 at 1636 Referred by Attending Franky Redia SAILOR, MD   Patient coming from:    home Lives alone,   *** With family      Chief Complaint:   Chief Complaint  Patient presents with   Fever    HPI: Gwendolyn Sweeney is a 64 y.o. female with medical history significant of cystocele/rectocele frequent UTIs, hypothyroidism    Presented with   abdominal pain and fevers Patient presented to ER with fever and abd pain for 3 weeks This was attributed to influenza B and UTI She Continues to have some dysuria and ongoing intermittent fevers as well as cough She was treated with Macrobid  for her UTI Continued to have left lower quadrant abdominal pain despite antibiotics  Patient was seen at [facility] scan showed diverticular microperforation and possible fistula formation General Surgery was consulted Dr. Rubin is aware Patient was given a dose of cefepime  and Flagyl  And 2 L of lactic "Ringer's"      Denies significant ETOH intake *** Does not smoke*** but interested in quitting***  Denies marijuana use ***    Regarding pertinent Chronic problems:     Hypothyroidism:   Lab Results  Component Value Date   TSH 3.520 03/23/2015   on synthroid     obesity-   BMI Readings from Last 1 Encounters:  06/25/24 33.33 kg/m     *** Asthma -well *** controlled on home inhalers/ nebs                     *** COPD - not **followed by pulmonology *** not  on baseline oxygen  *L,    *** OSA -on nocturnal oxygen, *CPAP, *noncompliant with CPAP    ***CKD stage III*-   baseline Cr **** Estimated Creatinine Clearance: 43 mL/min (A) (by C-G formula based on SCr of 1.15 mg/dL (H)).  Lab Results  Component Value Date   CREATININE 1.15 (H) 06/25/2024   CREATININE 0.97 03/23/2015   CREATININE 1.03 04/08/2013   Lab Results  Component Value Date   NA  137 06/25/2024   CL 95 (L) 06/25/2024   K 4.6 06/25/2024   CO2 29 06/25/2024   BUN 21 06/25/2024   CREATININE 1.15 (H) 06/25/2024   GFRNONAA 53 (L) 06/25/2024   CALCIUM 10.1 06/25/2024   ALBUMIN 3.8 06/25/2024   GLUCOSE 103 (H) 06/25/2024     *** Chronic anemia - baseline hg Hemoglobin & Hematocrit  Recent Labs    06/25/24 1650  HGB 11.9*   Iron/TIBC/Ferritin/ %Sat No results found for: IRON, TIBC, FERRITIN, IRONPCTSAT    While in ER: Clinical Course as of 06/25/24 2349  Tue Jun 25, 2024  2034 Spoke with general surgery, Dr. Rubin who noted that they will consult on her and that she should be medical admit at this time. [CB]    Clinical Course User Index [CB] Beola Terrall GORMAN, PA-C       Lab Orders         Urine Culture         Resp panel by RT-PCR (RSV, Flu A&B, Covid) Anterior Nasal Swab         Blood culture (routine x 2)         Lactic acid, plasma         Comprehensive metabolic panel  CBC with Differential         Urinalysis, w/ Reflex to Culture (Infection Suspected) -Urine, Clean Catch         Protime-INR       CTabd/pelvis chest- Acute sigmoid diverticulitis with microperforation and adjacent multiloculated left lower quadrant abscess measuring 6.9 x 5.3 x 6.4 cm. 2. Intraluminal bladder gas and bladder wall thickening superiorly worrisome for reactive cystitis. There is a questionable colovesicular fistula. 3. Note is made that the left ovary is not well seen and therefore, tubo-ovarian abscess would also be in the differential.  Following Medications were ordered in ER: Medications  lactated ringers  bolus 1,000 mL (has no administration in time range)  lactated ringers  bolus 1,000 mL (0 mLs Intravenous Stopped 06/25/24 2012)  iohexol  (OMNIPAQUE ) 300 MG/ML solution 100 mL (100 mLs Intravenous Contrast Given 06/25/24 1905)  ceFEPIme  (MAXIPIME ) 2 g in sodium chloride  0.9 % 100 mL IVPB (0 g Intravenous Stopped 06/25/24 2101)    And   metroNIDAZOLE  (FLAGYL ) IVPB 500 mg (500 mg Intravenous Transfusing/Transfer 06/25/24 2239)    _______________________________________________________ ER Provider Called:    General Surgery Dr. Rubin They Recommend admit to medicine   Will see in AM    ED Triage Vitals  Encounter Vitals Group     BP 06/25/24 1642 126/87     Girls Systolic BP Percentile --      Girls Diastolic BP Percentile --      Boys Systolic BP Percentile --      Boys Diastolic BP Percentile --      Pulse Rate 06/25/24 1642 (!) 116     Resp 06/25/24 1642 20     Temp 06/25/24 1642 100.1 F (37.8 C)     Temp Source 06/25/24 1642 Oral     SpO2 06/25/24 1642 94 %     Weight 06/25/24 1646 165 lb (74.8 kg)     Height 06/25/24 1646 4' 11 (1.499 m)     Head Circumference --      Peak Flow --      Pain Score 06/25/24 1646 0     Pain Loc --      Pain Education --      Exclude from Growth Chart --   UFJK(75)@     _________________________________________ Significant initial  Findings: Abnormal Labs Reviewed  COMPREHENSIVE METABOLIC PANEL WITH GFR - Abnormal; Notable for the following components:      Result Value   Chloride 95 (*)    Glucose, Bld 103 (*)    Creatinine, Ser 1.15 (*)    Total Protein 9.0 (*)    GFR, Estimated 53 (*)    All other components within normal limits  CBC WITH DIFFERENTIAL/PLATELET - Abnormal; Notable for the following components:   WBC 15.9 (*)    Hemoglobin 11.9 (*)    Platelets 546 (*)    Neutro Abs 13.4 (*)    Abs Immature Granulocytes 0.09 (*)    All other components within normal limits  URINALYSIS, W/ REFLEX TO CULTURE (INFECTION SUSPECTED) - Abnormal; Notable for the following components:   APPearance CLOUDY (*)    Hgb urine dipstick MODERATE (*)    Ketones, ur TRACE (*)    Protein, ur 100 (*)    Leukocytes,Ua LARGE (*)    All other components within normal limits  PROTIME-INR - Abnormal; Notable for the following components:   Prothrombin Time 15.8 (*)    All  other components within normal limits  ECG: Ordered Personally reviewed and interpreted by me showing: HR : 91 Rhythm:  NSR,   no evidence of ischemic changes QTC 425   The recent clinical data is shown below. Vitals:   06/25/24 2145 06/25/24 2200 06/25/24 2215 06/25/24 2319  BP: 127/69 (!) 111/94 122/67 135/70  Pulse: 89 94 (!) 101 94  Resp: 18 12 16 16   Temp:    99.8 F (37.7 C)  TempSrc:    Oral  SpO2: 92% 94% 92% 96%  Weight:      Height:        WBC     Component Value Date/Time   WBC 15.9 (H) 06/25/2024 1650   LYMPHSABS 1.5 06/25/2024 1650   MONOABS 0.9 06/25/2024 1650   EOSABS 0.1 06/25/2024 1650   BASOSABS 0.0 06/25/2024 1650    Lactic Acid, Venous    Component Value Date/Time   LATICACIDVEN 1.2 06/25/2024 1652       UA pyuria   Urine analysis:    Component Value Date/Time   COLORURINE YELLOW 06/25/2024 1650   APPEARANCEUR CLOUDY (A) 06/25/2024 1650   APPEARANCEUR Clear 04/11/2023 1515   LABSPEC 1.029 06/25/2024 1650   PHURINE 5.5 06/25/2024 1650   GLUCOSEU NEGATIVE 06/25/2024 1650   HGBUR MODERATE (A) 06/25/2024 1650   BILIRUBINUR NEGATIVE 06/25/2024 1650   BILIRUBINUR Negative 04/11/2023 1515   KETONESUR TRACE (A) 06/25/2024 1650   PROTEINUR 100 (A) 06/25/2024 1650   UROBILINOGEN 0.2 02/13/2023 0942   NITRITE NEGATIVE 06/25/2024 1650   LEUKOCYTESUR LARGE (A) 06/25/2024 1650    Results for orders placed or performed during the hospital encounter of 06/25/24  Blood culture (routine x 2)     Status: None (Preliminary result)   Collection Time: 06/25/24  6:40 PM   Specimen: BLOOD RIGHT HAND  Result Value Ref Range Status   Specimen Description   Final    BLOOD RIGHT HAND Performed at [facility], [address], [place], [place] [address]    Special Requests   Final    Blood Culture adequate volume BOTTLES DRAWN AEROBIC AND ANAEROBIC Performed at [facility], [address], [place], [place] [address]     Culture PENDING  Incomplete   Report Status PENDING  Incomplete  Resp panel by RT-PCR (RSV, Flu A&B, Covid) Anterior Nasal Swab     Status: None   Collection Time: 06/25/24  7:01 PM   Specimen: Anterior Nasal Swab  Result Value Ref Range Status   SARS Coronavirus 2 by RT PCR NEGATIVE NEGATIVE Final         Influenza A by PCR NEGATIVE NEGATIVE Final   Influenza B by PCR NEGATIVE NEGATIVE Final         Resp Syncytial Virus by PCR NEGATIVE NEGATIVE Final          ABX started Antibiotics Given (last 72 hours)     Date/Time Action Medication Dose Rate   06/25/24 2031 New Bag/Given   ceFEPIme  (MAXIPIME ) 2 g in sodium chloride  0.9 % 100 mL IVPB 2 g 200 mL/hr   06/25/24 2141 New Bag/Given   metroNIDAZOLE  (FLAGYL ) IVPB 500 mg 500 mg 100 mL/hr         __________________________________________________________ Recent Labs  Lab 06/25/24 1650  NA 137  K 4.6  CO2 29  GLUCOSE 103*  BUN 21  CREATININE 1.15*  CALCIUM 10.1    Cr   Up from baseline see below Lab Results  Component Value Date   CREATININE 1.15 (H) 06/25/2024  CREATININE 0.97 03/23/2015   CREATININE 1.03 04/08/2013    Recent Labs  Lab 06/25/24 1650  AST 25  ALT 22  ALKPHOS 102  BILITOT 0.6  PROT 9.0*  ALBUMIN 3.8   Lab Results  Component Value Date   CALCIUM 10.1 06/25/2024    Plt: Lab Results  Component Value Date   PLT 546 (H) 06/25/2024       Recent Labs  Lab 06/25/24 1650  WBC 15.9*  NEUTROABS 13.4*  HGB 11.9*  HCT 37.0  MCV 86.4  PLT 546*    HG/HCT      Component Value Date/Time   HGB 11.9 (L) 06/25/2024 1650   HGB 13.4 03/23/2015 0922   HCT 37.0 06/25/2024 1650   HCT 40.8 03/23/2015 0922   MCV 86.4 06/25/2024 1650   MCV 87 03/23/2015 0922     _______________________________________________ Hospitalist was called for admission for   Diverticulitis of large intestine with perforation, unspecified bleeding status  Left lower quadrant abdominal abscess     The following Work  up has been ordered so far:  Orders Placed This Encounter  Procedures   Critical Care   Urine Culture   Resp panel by RT-PCR (RSV, Flu A&B, Covid) Anterior Nasal Swab   Blood culture (routine x 2)   CT CHEST ABDOMEN PELVIS W CONTRAST   Lactic acid, plasma   Comprehensive metabolic panel   CBC with Differential   Urinalysis, w/ Reflex to Culture (Infection Suspected) -Urine, Clean Catch   Protime-INR   Diet NPO time specified   Consult to general surgery   Consult to hospitalist   EKG 12-Lead   EKG 12-Lead   Admit to Inpatient (patient's expected length of stay will be greater than 2 midnights or inpatient only procedure)     OTHER Significant initial  Findings:  labs showing:     DM  labs:  HbA1C: No results for input(s): HGBA1C in the last 8760 hours.     CBG (last 3)  No results for input(s): GLUCAP in the last 72 hours.        Cultures:    Component Value Date/Time   SDES  06/25/2024 1840    BLOOD RIGHT HAND Performed at [facility], [address] [place], [place] [address]    [facility]  06/25/2024 1840    Blood Culture adequate volume BOTTLES DRAWN AEROBIC AND ANAEROBIC Performed at [facility], [address], [place], [place] [address]    CULT PENDING 06/25/2024 1840   REPTSTATUS PENDING 06/25/2024 1840     Radiological Exams on Admission: CT CHEST ABDOMEN PELVIS W CONTRAST Result Date: 06/25/2024 EXAM: CT CHEST WITH CONTRAST 06/25/2024 07:11:57 PM TECHNIQUE: CT of the chest was performed with the administration of intravenous contrast. Multiplanar reformatted images are provided for review. Automated exposure control, iterative reconstruction, and/or weight based adjustment of the mA/kV was utilized to reduce the radiation dose to as low as reasonably achievable. COMPARISON: CT chest 02/01/2022 and CT abdomen and pelvis 05/03/2000. CLINICAL HISTORY: FINDINGS: MEDIASTINUM: Heart and pericardium are  unremarkable. The central airways are clear. LYMPH NODES: No mediastinal, hilar or axillary lymphadenopathy. LUNGS AND PLEURA: There is a linear band of atelectasis or scarring in the left lower lobe. The lungs are otherwise clear. No pleural effusion or pneumothorax. SOFT TISSUES/BONES: No acute abnormality of the bones or soft tissues. UPPER ABDOMEN: There is wall thickening of the dome of the liver. There is a small amount of air in the bladder. Cannot exclude  fistulous connection to sigmoid colon on sagittal image 5/59. There is wall thickening and inflammatory stranding surrounding the sigmoid colon. There is scattered colonic diverticula. There is an adjacent multiloculated enhancing air fluid collection in the left lower quadrant abutting the sigmoid colon measuring 6.9 x 5.3 x 6.4 cm. There is a small amount of microperforation seen on image 2/97. The appendix appears normal. The uterus is surgically absent. Ovaries are not well delineated on this study. IMPRESSION: 1. Acute sigmoid diverticulitis with microperforation and adjacent multiloculated left lower quadrant abscess measuring 6.9 x 5.3 x 6.4 cm. 2. Intraluminal bladder gas and bladder wall thickening superiorly worrisome for reactive cystitis. There is a questionable colovesicular fistula. 3. Note is made that the left ovary is not well seen and therefore, tubo-ovarian abscess would also be in the differential. Electronically signed by: Greig Pique MD 06/25/2024 07:24 PM EST RP Workstation: [id number]   _______________________________________________________________________________________________________ Latest  Blood pressure 135/70, pulse 94, temperature 99.8 F (37.7 C), temperature source Oral, resp. rate 16, height 4' 11 (1.499 m), weight 74.8 kg, SpO2 96%.   Vitals  labs and radiology finding personally reviewed  Review of Systems:    Pertinent positives include: ***  Constitutional:  No weight loss, night sweats, Fevers, chills,  fatigue, weight loss  HEENT:  No headaches, Difficulty swallowing,Tooth/dental problems,Sore throat,  No sneezing, itching, ear ache, nasal congestion, post nasal drip,  Cardio-vascular:  No chest pain, Orthopnea, PND, anasarca, dizziness, palpitations.no Bilateral lower extremity swelling  GI:  No heartburn, indigestion, abdominal pain, nausea, vomiting, diarrhea, change in bowel habits, loss of appetite, melena, blood in stool, hematemesis Resp:  no shortness of breath at rest. No dyspnea on exertion, No excess mucus, no productive cough, No non-productive cough, No coughing up of blood.No change in color of mucus.No wheezing. Skin:  no rash or lesions. No jaundice GU:  no dysuria, change in color of urine, no urgency or frequency. No straining to urinate.  No flank pain.  Musculoskeletal:  No joint pain or no joint swelling. No decreased range of motion. No back pain.  Psych:  No change in mood or affect. No depression or anxiety. No memory loss.  Neuro: no localizing neurological complaints, no tingling, no weakness, no double vision, no gait abnormality, no slurred speech, no confusion  All systems reviewed and apart from HOPI all are negative _______________________________________________________________________________________________ Past Medical History:   Past Medical History:  Diagnosis Date   Fatigue 04/08/2013   Fatty liver 10/11/2016   Pelvic floor relaxation 03/23/2015   Thyroid  disease    Ulcer    Urinary tract bacterial infections       Past Surgical History:  Procedure Laterality Date   ABDOMINAL HYSTERECTOMY     carpel tunnel     COLONOSCOPY N/A 01/20/2016   Procedure: COLONOSCOPY;  Surgeon: Claudis RAYMOND Rivet, MD;  Location: [facility];  Service: Endoscopy;  Laterality: N/A;  830   HERNIA REPAIR     TUBAL LIGATION     WISDOM TOOTH EXTRACTION      Social History:  Ambulatory *** independently cane, walker  wheelchair bound, bed bound      reports that she has never smoked. She has never used smokeless tobacco. She reports that she does not drink alcohol and does not use drugs.     Family History: *** Family History  Problem Relation Age of Onset   Heart disease Mother    Heart disease Father    Diabetes Daughter    Diabetes Paternal  Grandfather    Heart disease Paternal Grandfather    Macular degeneration Paternal Grandfather    Glaucoma Maternal Grandmother    Macular degeneration Sister    Other Other        paternal 2nd cousin-tested + for gene for breast cancer   ______________________________________________________________________________________________ Allergies: Allergies[1]   Prior to Admission medications  Medication Sig Start Date End Date Taking? Authorizing Provider  Ascorbic Acid (VITAMIN C PO) Take by mouth.    [provider]  cetirizine  (ZYRTEC  ALLERGY) 10 MG tablet Take 1 tablet (10 mg total) by mouth daily. 01/01/19   Signa Delon LABOR, NP  ELDERBERRY PO Take by mouth. Patient not taking: Reported on 04/11/2023    [provider]  levocetirizine (XYZAL ) 5 MG tablet Take 1 tablet (5 mg total) by mouth every evening. 05/29/22   Leath-Warren, Etta PARAS, NP  levothyroxine  (SYNTHROID , LEVOTHROID) 200 MCG tablet Take 200 mcg by mouth daily before breakfast.    [provider]  Multiple Vitamin (MULTIVITAMIN) capsule Take 1 capsule by mouth daily.    [provider]  Probiotic Product (PROBIOTIC PO) Take by mouth. Patient not taking: Reported on 04/11/2023    [provider]  sulfamethoxazole -trimethoprim  (BACTRIM  DS) 800-160 MG tablet Take 1 tablet by mouth 2 (two) times daily. Take 1 bid 04/30/24   Signa Delon LABOR, NP    ___________________________________________________________________________________________________ Physical Exam:    06/25/2024   11:19 PM 06/25/2024   10:15 PM 06/25/2024   10:00 PM  Vitals with BMI  Systolic 135 122  111  Diastolic 70 67 94  Pulse 94 101 94     1. General:  in No ***Acute distress***increased work of breathing ***complaining of severe pain****agitated * Chronically ill *well *cachectic *toxic acutely ill -appearing 2. Psychological: Alert and *** Oriented 3. Head/ENT:   Moist *** Dry Mucous Membranes                          Head Non traumatic, neck supple                          Normal *** Poor Dentition 4. SKIN: normal *** decreased Skin turgor,  Skin clean Dry and intact no rash    5. Heart: Regular rate and rhythm no*** Murmur, no Rub or gallop 6. Lungs: ***Clear to auscultation bilaterally, no wheezes or crackles   7. Abdomen: Soft, ***non-tender, Non distended *** obese ***bowel sounds present 8. Lower extremities: no clubbing, cyanosis, no ***edema 9. Neurologically Grossly intact, moving all 4 extremities equally *** strength 5 out of 5 in all 4 extremities cranial nerves II through XII intact 10. MSK: Normal range of motion    Chart has been reviewed  ______________________________________________________________________________________________  Assessment/Plan  ***  Admitted for *** Diverticulitis of large intestine with perforation, unspecified bleeding status ***  Left lower quadrant abdominal abscess (HCC) ***  Sepsis, due to unspecified organism, unspecified whether acute organ dysfunction present (HCC) ***  Thrombocytosis ***  Anemia, unspecified type ***    Present on Admission:  Diverticulitis of colon with perforation     No problem-specific Assessment & Plan notes found for this encounter.    Other plan as per orders.  DVT prophylaxis:  SCD *** Lovenox       Code Status:    Code Status: Not on file FULL CODE *** DNR/DNI ***comfort care as per patient ***family  I had personally discussed CODE STATUS  with patient and family*  ACP *** none has been reviewed ***   Family Communication:   Family not at  Bedside  plan of care was  discussed on the phone with *** Son, Daughter, Wife, Husband, Sister, Brother , father, mother  Diet  Diet Orders (From admission, onward)     Start     Ordered   06/25/24 2019  Diet NPO time specified  Diet effective now        06/25/24 2018            Disposition Plan:   *** likely will need placement for rehabilitation                          Back to current facility when stable                            To home once workup is complete and patient is stable  ***Following barriers for discharge:                             Chest pain *** Stroke *** Syncope ***work up is complete                            Electrolytes corrected                               Anemia corrected h/H stable                             Pain controlled with PO medications                               Afebrile, white count improving able to transition to PO antibiotics                             Will need to be able to tolerate PO                            Will likely need home health, home O2, set up                           Will need consultants to evaluate patient prior to discharge                           Work of breathing improves       Consult Orders  (From admission, onward)           Start     Ordered   06/25/24 2037  Consult to hospitalist  Paged by Lyle, spoke to Luke  Once       Provider:  (Not yet assigned)  Question Answer Comment  Place call to: [facility]   Reason for Consult Admit      06/25/24 2036                              ***Would benefit  from PT/OT eval prior to DC  Ordered                   Swallow eval - SLP ordered                   Diabetes care coordinator                   Transition of care consulted                   Nutrition    consulted                  Wound care  consulted                   Palliative care    consulted                   Behavioral health  consulted                    Consults called: ***  NONE   Admission  status:  ED Disposition     ED Disposition  Admit   Condition  --   Comment  Hospital Area: [facility] [[id number]]  Level of Care: Med-Surg [16]  May admit patient to [facility] or [facility] if equivalent level of care is available:: Yes  Interfacility transfer: Yes  Diagnosis: Diverticulitis of colon with perforation [658807]  Admitting Physician: FRANKY REDIA SAILOR [[id number]]  Attending Physician: FRANKY REDIA SAILOR [phone]  Certification:: I certify this patient will need inpatient services for at least 2 midnights  Expected Medical Readiness: 06/27/2024           Obs***  ***  inpatient     I Expect 2 midnight stay secondary to severity of patient's current illness need for inpatient interventions justified by the following: ***hemodynamic instability despite optimal treatment (tachycardia *hypotension * tachypnea *hypoxia, hypercapnia) *** Severe lab/radiological/exam abnormalities including:    Diverticulitis of large intestine with perforation, unspecified bleeding status ***  Left lower quadrant abdominal abscess (HCC) ***  Sepsis, due to unspecified organism, unspecified whether acute organ dysfunction present (HCC) ***  Thrombocytosis ***  Anemia, unspecified type ***  and extensive comorbidities including: *substance abuse  *Chronic pain *DM2  * CHF * CAD  * COPD/asthma *Morbid Obesity * CKD *dementia *liver disease *history of stroke with residual deficits *  malignancy, * sickle cell disease  History of amputation Chronic anticoagulation  That are currently affecting medical management.   I expect  patient to be hospitalized for 2 midnights requiring inpatient medical care.  Patient is at high risk for adverse outcome (such as loss of life or disability) if not treated.  Indication for inpatient stay as follows:  Severe change from baseline regarding mental status Hemodynamic instability despite maximal medical  therapy,  severe pain requiring acute inpatient management,  inability to maintain oral hydration   persistent chest pain despite medical management Need for operative/procedural  intervention New or worsening hypoxia ongoing suicidal ideations   Need for IV antibiotics, IV fluids,, IV pain medications, IV anticoagulation,  IV rate controling medications, IV antihypertensives need for biPAP Frequent labs    Level of care      medical floor     Shailyn Weyandt 06/25/2024, 11:49 PM ***  [facility]     after 2 AM please page floor coverage   If 7AM-7PM, please contact the day  team taking care of the patient using [email or web address]          [1] Allergies Allergen Reactions   Hydrocodone Itching   Ampicillin  Rash  "

## 2024-06-26 ENCOUNTER — Inpatient Hospital Stay (HOSPITAL_COMMUNITY)

## 2024-06-26 ENCOUNTER — Encounter (HOSPITAL_COMMUNITY): Payer: Self-pay | Admitting: Internal Medicine

## 2024-06-26 DIAGNOSIS — E860 Dehydration: Secondary | ICD-10-CM | POA: Diagnosis present

## 2024-06-26 DIAGNOSIS — N321 Vesicointestinal fistula: Secondary | ICD-10-CM

## 2024-06-26 DIAGNOSIS — D649 Anemia, unspecified: Secondary | ICD-10-CM | POA: Diagnosis not present

## 2024-06-26 DIAGNOSIS — E039 Hypothyroidism, unspecified: Secondary | ICD-10-CM | POA: Diagnosis not present

## 2024-06-26 DIAGNOSIS — K5720 Diverticulitis of large intestine with perforation and abscess without bleeding: Secondary | ICD-10-CM | POA: Diagnosis not present

## 2024-06-26 LAB — COMPREHENSIVE METABOLIC PANEL WITH GFR
ALT: 17 U/L (ref 0–44)
AST: 21 U/L (ref 15–41)
Albumin: 3 g/dL — ABNORMAL LOW (ref 3.5–5.0)
Alkaline Phosphatase: 101 U/L (ref 38–126)
Anion gap: 11 (ref 5–15)
BUN: 19 mg/dL (ref 8–23)
CO2: 27 mmol/L (ref 22–32)
Calcium: 9 mg/dL (ref 8.9–10.3)
Chloride: 98 mmol/L (ref 98–111)
Creatinine, Ser: 1.08 mg/dL — ABNORMAL HIGH (ref 0.44–1.00)
GFR, Estimated: 57 mL/min — ABNORMAL LOW
Glucose, Bld: 87 mg/dL (ref 70–99)
Potassium: 4.4 mmol/L (ref 3.5–5.1)
Sodium: 136 mmol/L (ref 135–145)
Total Bilirubin: 0.5 mg/dL (ref 0.0–1.2)
Total Protein: 7.4 g/dL (ref 6.5–8.1)

## 2024-06-26 LAB — CBC
HCT: 30.5 % — ABNORMAL LOW (ref 36.0–46.0)
Hemoglobin: 9.9 g/dL — ABNORMAL LOW (ref 12.0–15.0)
MCH: 27.7 pg (ref 26.0–34.0)
MCHC: 32.5 g/dL (ref 30.0–36.0)
MCV: 85.2 fL (ref 80.0–100.0)
Platelets: 472 K/uL — ABNORMAL HIGH (ref 150–400)
RBC: 3.58 MIL/uL — ABNORMAL LOW (ref 3.87–5.11)
RDW: 13.9 % (ref 11.5–15.5)
WBC: 15.4 K/uL — ABNORMAL HIGH (ref 4.0–10.5)
nRBC: 0 % (ref 0.0–0.2)

## 2024-06-26 LAB — MAGNESIUM: Magnesium: 2.1 mg/dL (ref 1.7–2.4)

## 2024-06-26 LAB — FOLATE: Folate: >20 ng/mL

## 2024-06-26 LAB — TYPE AND SCREEN
ABO/RH(D): O POS
Antibody Screen: NEGATIVE

## 2024-06-26 LAB — RETICULOCYTES
Immature Retic Fract: 7.4 % (ref 2.3–15.9)
RBC.: 3.5 MIL/uL — ABNORMAL LOW (ref 3.87–5.11)
Retic Count, Absolute: 22.1 K/uL (ref 19.0–186.0)
Retic Ct Pct: 0.6 % (ref 0.4–3.1)

## 2024-06-26 LAB — IRON AND TIBC
Iron: 16 ug/dL — ABNORMAL LOW (ref 28–170)
Saturation Ratios: 9 % — ABNORMAL LOW (ref 10.4–31.8)
TIBC: 176 ug/dL — ABNORMAL LOW (ref 250–450)
UIBC: 160 ug/dL

## 2024-06-26 LAB — SODIUM, URINE, RANDOM: Sodium, Ur: 89 mmol/L

## 2024-06-26 LAB — HIV ANTIBODY (ROUTINE TESTING W REFLEX): HIV Screen 4th Generation wRfx: NONREACTIVE

## 2024-06-26 LAB — CREATININE, URINE, RANDOM: Creatinine, Urine: 125 mg/dL

## 2024-06-26 LAB — TSH: TSH: 2.85 u[IU]/mL (ref 0.350–4.500)

## 2024-06-26 LAB — VITAMIN B12: Vitamin B-12: 509 pg/mL (ref 180–914)

## 2024-06-26 LAB — OSMOLALITY: Osmolality: 295 mosm/kg (ref 275–295)

## 2024-06-26 LAB — OSMOLALITY, URINE: Osmolality, Ur: 731 mosm/kg (ref 300–900)

## 2024-06-26 LAB — FERRITIN: Ferritin: 904 ng/mL — ABNORMAL HIGH (ref 11–307)

## 2024-06-26 LAB — PHOSPHORUS: Phosphorus: 4 mg/dL (ref 2.5–4.6)

## 2024-06-26 MED ORDER — ACETAMINOPHEN 325 MG PO TABS
650.0000 mg | ORAL_TABLET | Freq: Four times a day (QID) | ORAL | Status: DC | PRN
  Administered 2024-06-26 – 2024-06-28 (×3): 650 mg via ORAL
  Filled 2024-06-26 (×3): qty 2

## 2024-06-26 MED ORDER — MIDAZOLAM HCL (PF) 2 MG/2ML IJ SOLN
INTRAMUSCULAR | Status: AC | PRN
  Administered 2024-06-26 (×4): 1 mg via INTRAVENOUS

## 2024-06-26 MED ORDER — FENTANYL CITRATE (PF) 50 MCG/ML IJ SOSY
12.5000 ug | PREFILLED_SYRINGE | INTRAMUSCULAR | Status: DC | PRN
  Administered 2024-06-26: 50 ug via INTRAVENOUS
  Filled 2024-06-26: qty 1

## 2024-06-26 MED ORDER — ONDANSETRON HCL 4 MG/2ML IJ SOLN: 4.0000 mg | Freq: Four times a day (QID) | INTRAMUSCULAR | Status: DC | PRN

## 2024-06-26 MED ORDER — SODIUM CHLORIDE 0.9 % IV SOLN
2.0000 g | Freq: Two times a day (BID) | INTRAVENOUS | Status: DC
  Administered 2024-06-26 – 2024-07-01 (×11): 2 g via INTRAVENOUS
  Filled 2024-06-26 (×12): qty 12.5

## 2024-06-26 MED ORDER — SODIUM CHLORIDE 0.9 % IV SOLN: INTRAVENOUS | Status: AC

## 2024-06-26 MED ORDER — LEVOTHYROXINE SODIUM 50 MCG PO TABS
50.0000 ug | ORAL_TABLET | Freq: Every day | ORAL | Status: DC
  Administered 2024-06-27 – 2024-07-01 (×5): 50 ug via ORAL
  Filled 2024-06-26 (×5): qty 1

## 2024-06-26 MED ORDER — DIPHENHYDRAMINE HCL 50 MG/ML IJ SOLN
INTRAMUSCULAR | Status: AC | PRN
  Administered 2024-06-26: 25 mg via INTRAVENOUS

## 2024-06-26 MED ORDER — MIDAZOLAM HCL 2 MG/2ML IJ SOLN
INTRAMUSCULAR | Status: AC
  Filled 2024-06-26: qty 2

## 2024-06-26 MED ORDER — SODIUM CHLORIDE 0.9% FLUSH
5.0000 mL | Freq: Three times a day (TID) | INTRAVENOUS | Status: DC
  Administered 2024-06-26 – 2024-07-01 (×13): 5 mL

## 2024-06-26 MED ORDER — ACETAMINOPHEN 650 MG RE SUPP: 650.0000 mg | Freq: Four times a day (QID) | RECTAL | Status: DC | PRN

## 2024-06-26 MED ORDER — FENTANYL CITRATE (PF) 100 MCG/2ML IJ SOLN
INTRAMUSCULAR | Status: AC
  Filled 2024-06-26: qty 2

## 2024-06-26 MED ORDER — FENTANYL CITRATE (PF) 100 MCG/2ML IJ SOLN
INTRAMUSCULAR | Status: AC | PRN
  Administered 2024-06-26 (×2): 50 ug via INTRAVENOUS

## 2024-06-26 MED ORDER — ONDANSETRON HCL 4 MG PO TABS: 4.0000 mg | ORAL_TABLET | Freq: Four times a day (QID) | ORAL | Status: DC | PRN

## 2024-06-26 MED ORDER — DIPHENHYDRAMINE HCL 50 MG/ML IJ SOLN
INTRAMUSCULAR | Status: AC
  Filled 2024-06-26: qty 1

## 2024-06-26 NOTE — Assessment & Plan Note (Signed)
 CT scan worrisome for colovesicular fistula.  Defer to general surgery for further evaluation Continue cefepime  and Flagyl  for now

## 2024-06-26 NOTE — TOC Initial Note (Signed)
 Transition of Care [facility]) - Initial/Assessment Note    Patient Details  Name: Gwendolyn Sweeney MRN: [id number] Date of Birth: [birth year]  Transition of Care [facility]) CM/SW Contact:    Alfonse JONELLE Rex, RN Phone Number: 06/26/2024, 1:26 PM  Clinical Narrative:  Admitted from home, presented to ED with c/o 3 weeks of abdominal pain, intermittent fevers and UTI.  IV antibiotics for diverticular abscess, bowel rest, IR consult for drain placement.   Surgery following. Resides in private residence with spouse, has PCP and insurance on file. CM following for dc needs.                     Patient Goals and CMS Choice            Expected Discharge Plan and Services                                              Prior Living Arrangements/Services                       Activities of Daily Living   ADL Screening (condition at time of admission) Independently performs ADLs?: Yes (appropriate for developmental age) Is the patient deaf or have difficulty hearing?: No Does the patient have difficulty seeing, even when wearing glasses/contacts?: No Does the patient have difficulty concentrating, remembering, or making decisions?: No  Permission Sought/Granted                  Emotional Assessment              Admission diagnosis:  Thrombocytosis [D75.839] Diverticulitis of colon with perforation [K57.20] Left lower quadrant abdominal abscess (HCC) [K65.1] Anemia, unspecified type [D64.9] Diverticulitis of large intestine with perforation, unspecified bleeding status [K57.20] Sepsis, due to unspecified organism, unspecified whether acute organ dysfunction present [facility]) [A41.9] 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   Hypothyroidism 06/26/2024   Anemia 06/26/2024   Dehydration 06/26/2024   Diverticulitis of colon with perforation 06/25/2024   Cystocele with rectocele 03/16/2023   Encounter for well woman  exam with routine gynecological exam 03/16/2023   Urinary frequency 03/16/2023   Encounter for screening fecal occult blood testing 08/10/2020   Visit for pelvic exam 08/10/2020   S/P hysterectomy 08/10/2020   Dysuria 08/10/2020   Hematuria 08/10/2020   Fatty liver 10/11/2016   Rectocele 08/08/2016   Pelvic floor relaxation 03/23/2015   Fatigue 04/08/2013   PCP:  [name], MD Pharmacy:   [facility] [address] [place] [address] [place] [address] [place] [address] Phone: [phone] Fax: [phone]     Social Drivers of Health (SDOH) Social History: SDOH Screenings   Food Insecurity: No Food Insecurity (06/25/2024)  Housing: Low Risk (06/25/2024)  Transportation Needs: No Transportation Needs (06/25/2024)  Utilities: Not At Risk (06/25/2024)  Alcohol Screen: Low Risk (03/16/2023)  Depression (PHQ2-9): Low Risk (03/16/2023)  Financial Resource Strain: Low Risk (03/16/2023)  Physical Activity: Inactive (03/16/2023)  Social Connections: Socially Integrated (06/25/2024)  Stress: No Stress Concern Present (03/16/2023)  Tobacco Use: Low Risk (06/26/2024)   SDOH Interventions:     Readmission Risk Interventions     No data to display

## 2024-06-26 NOTE — Assessment & Plan Note (Signed)
 Noted mild AKI will rehydrate obtain electrolytes follow renal function

## 2024-06-26 NOTE — Assessment & Plan Note (Signed)
Ob tain anemia panel 

## 2024-06-26 NOTE — Assessment & Plan Note (Signed)
 Check TSH and restart Synthroid  when able to tolerate p.o.

## 2024-06-26 NOTE — Consult Note (Signed)
 "     Chief Complaint: Patient was seen in consultation today for diverticular abscess  Referring Physician(s): Dr. Mitzie Freund  Supervising Physician: Johann Sieving  Patient Status: [facility] - In-pt  History of Present Illness: Gwendolyn Sweeney is a 64 y.o. female with history of hepatic steatosis, cystocele, rectocele, diverticulosis, thyroid  disease, and UTIs who presents with a diverticular abscess and colovesical fistula.  She has been seen by General surgery who has recommended temporization until eventual resection and repair. IR consulted for aspiration and drainage.   Patient assessed at bedside. Husband also at bedside. She is understanding of the goals of the procedure and is agreeable to proceed.   Past Medical History:  Diagnosis Date   Fatigue 04/08/2013   Fatty liver 10/11/2016   Pelvic floor relaxation 03/23/2015   Thyroid  disease    Ulcer    Urinary tract bacterial infections     Past Surgical History:  Procedure Laterality Date   ABDOMINAL HYSTERECTOMY     carpel tunnel     COLONOSCOPY N/A 01/20/2016   Procedure: COLONOSCOPY;  Surgeon: Claudis RAYMOND Rivet, MD;  Location: [facility];  Service: Endoscopy;  Laterality: N/A;  830   HERNIA REPAIR     TUBAL LIGATION     WISDOM TOOTH EXTRACTION      Allergies: Hydrocodone and Ampicillin   Medications: Prior to Admission medications  Medication Sig Start Date End Date Taking? Authorizing Provider  Ascorbic Acid (VITAMIN C PO) Take by mouth.   Yes [provider]  benzonatate (TESSALON) 200 MG capsule Take 200 mg by mouth 3 (three) times daily as needed. 06/13/24  Yes [provider]  levothyroxine  (SYNTHROID ) 50 MCG tablet Take 50 mcg by mouth daily before breakfast.   Yes [provider]  Multiple Vitamin (MULTIVITAMIN) capsule Take 1 capsule by mouth daily.   Yes [provider]  cetirizine  (ZYRTEC  ALLERGY) 10 MG tablet Take 1 tablet (10 mg total) by mouth daily. Patient not  taking: Reported on 06/25/2024 01/01/19   Signa Nest A, NP  levocetirizine (XYZAL ) 5 MG tablet Take 1 tablet (5 mg total) by mouth every evening. Patient not taking: Reported on 06/25/2024 05/29/22   Leath-Warren, Etta PARAS, NP  sulfamethoxazole -trimethoprim  (BACTRIM  DS) 800-160 MG tablet Take 1 tablet by mouth 2 (two) times daily. Take 1 bid Patient not taking: Reported on 06/25/2024 04/30/24   Signa Nest LABOR, NP     Family History  Problem Relation Age of Onset   Heart disease Mother    Heart disease Father    Diabetes Daughter    Diabetes Paternal Grandfather    Heart disease Paternal Grandfather    Macular degeneration Paternal Grandfather    Glaucoma Maternal Grandmother    Macular degeneration Sister    Other Other        paternal 2nd cousin-tested + for gene for breast cancer    Social History   Socioeconomic History   Marital status: Married    Spouse name: Not on file   Number of children: Not on file   Years of education: Not on file   Highest education level: Not on file  Occupational History   Not on file  Tobacco Use   Smoking status: Never   Smokeless tobacco: Never  Vaping Use   Vaping status: Never Used  Substance and Sexual Activity   Alcohol use: No   Drug use: No   Sexual activity: Yes    Birth control/protection: Surgical    Comment: hyst  Other Topics Concern   Not on file  Social History Narrative   Not on file   Social Drivers of Health   Tobacco Use: Low Risk (06/26/2024)   Patient History    Smoking Tobacco Use: Never    Smokeless Tobacco Use: Never    Passive Exposure: Not on file  Financial Resource Strain: Low Risk (03/16/2023)   Overall Financial Resource Strain (CARDIA)    Difficulty of Paying Living Expenses: Not very hard  Food Insecurity: No Food Insecurity (06/25/2024)   Epic    Worried About Radiation Protection Practitioner of Food in the Last Year: Never true    Ran Out of Food in the Last Year: Never true  Transportation Needs: No  Transportation Needs (06/25/2024)   Epic    Lack of Transportation (Medical): No    Lack of Transportation (Non-Medical): No  Physical Activity: Inactive (03/16/2023)   Exercise Vital Sign    Days of Exercise per Week: 0 days    Minutes of Exercise per Session: 0 min  Stress: No Stress Concern Present (03/16/2023)   [employer] of Occupational Health - Occupational Stress Questionnaire    Feeling of Stress : Only a little  Social Connections: Socially Integrated (06/25/2024)   Social Connection and Isolation Panel    Frequency of Communication with Friends and Family: More than three times a week    Frequency of Social Gatherings with Friends and Family: More than three times a week    Attends Religious Services: More than 4 times per year    Active Member of Clubs or Organizations: Yes    Attends Banker Meetings: More than 4 times per year    Marital Status: Married  Depression (PHQ2-9): Low Risk (03/16/2023)   Depression (PHQ2-9)    PHQ-2 Score: 1  Alcohol Screen: Low Risk (03/16/2023)   Alcohol Screen    Last Alcohol Screening Score (AUDIT): 0  Housing: Low Risk (06/25/2024)   Epic    Unable to Pay for Housing in the Last Year: No    Number of Times Moved in the Last Year: 0    Homeless in the Last Year: No  Utilities: Not At Risk (06/25/2024)   Epic    Threatened with loss of utilities: No  Health Literacy: Not on file     Review of Systems: A 12 point ROS discussed and pertinent positives are indicated in the HPI above.  All other systems are negative.  Review of Systems  Constitutional:  Negative for fatigue and fever.  Respiratory:  Negative for cough and shortness of breath.   Cardiovascular:  Negative for chest pain.  Gastrointestinal:  Negative for abdominal pain, nausea and vomiting.  Musculoskeletal:  Negative for back pain.  Psychiatric/Behavioral:  Negative for behavioral problems and confusion.     Vital Signs: BP (!) 158/81   Pulse 96    Temp 99.6 F (37.6 C) (Oral)   Resp 16   Ht 4' 11 (1.499 m)   Wt 165 lb (74.8 kg)   SpO2 96%   BMI 33.33 kg/m   Physical Exam Vitals and nursing note reviewed.  Constitutional:      General: She is not in acute distress.    Appearance: Normal appearance. She is not ill-appearing.  HENT:     Mouth/Throat:     Mouth: Mucous membranes are moist.     Pharynx: Oropharynx is clear.  Cardiovascular:     Rate and Rhythm: Normal rate and regular rhythm.  Pulmonary:  Effort: Pulmonary effort is normal. No respiratory distress.  Abdominal:     General: Abdomen is flat. There is no distension.     Palpations: Abdomen is soft.     Tenderness: There is abdominal tenderness.  Skin:    General: Skin is warm and dry.  Neurological:     General: No focal deficit present.     Mental Status: She is alert. Mental status is at baseline.  Psychiatric:        Mood and Affect: Mood normal.        Behavior: Behavior normal.        Thought Content: Thought content normal.        Judgment: Judgment normal.      MD Evaluation Airway: WNL Heart: WNL Abdomen: WNL Chest/ Lungs: WNL ASA  Classification: 3 Mallampati/Airway Score: Two   Imaging: CT CHEST ABDOMEN PELVIS W CONTRAST Result Date: 06/25/2024 EXAM: CT CHEST WITH CONTRAST 06/25/2024 07:11:57 PM TECHNIQUE: CT of the chest was performed with the administration of intravenous contrast. Multiplanar reformatted images are provided for review. Automated exposure control, iterative reconstruction, and/or weight based adjustment of the mA/kV was utilized to reduce the radiation dose to as low as reasonably achievable. COMPARISON: CT chest 02/01/2022 and CT abdomen and pelvis 05/03/2000. CLINICAL HISTORY: FINDINGS: MEDIASTINUM: Heart and pericardium are unremarkable. The central airways are clear. LYMPH NODES: No mediastinal, hilar or axillary lymphadenopathy. LUNGS AND PLEURA: There is a linear band of atelectasis or scarring in the left  lower lobe. The lungs are otherwise clear. No pleural effusion or pneumothorax. SOFT TISSUES/BONES: No acute abnormality of the bones or soft tissues. UPPER ABDOMEN: There is wall thickening of the dome of the liver. There is a small amount of air in the bladder. Cannot exclude fistulous connection to sigmoid colon on sagittal image 5/59. There is wall thickening and inflammatory stranding surrounding the sigmoid colon. There is scattered colonic diverticula. There is an adjacent multiloculated enhancing air fluid collection in the left lower quadrant abutting the sigmoid colon measuring 6.9 x 5.3 x 6.4 cm. There is a small amount of microperforation seen on image 2/97. The appendix appears normal. The uterus is surgically absent. Ovaries are not well delineated on this study. IMPRESSION: 1. Acute sigmoid diverticulitis with microperforation and adjacent multiloculated left lower quadrant abscess measuring 6.9 x 5.3 x 6.4 cm. 2. Intraluminal bladder gas and bladder wall thickening superiorly worrisome for reactive cystitis. There is a questionable colovesicular fistula. 3. Note is made that the left ovary is not well seen and therefore, tubo-ovarian abscess would also be in the differential. Electronically signed by: Greig Pique MD 06/25/2024 07:24 PM EST RP Workstation: [id number]    Labs:  CBC: Recent Labs    06/25/24 1650 06/26/24 0101  WBC 15.9* 15.4*  HGB 11.9* 9.9*  HCT 37.0 30.5*  PLT 546* 472*    COAGS: Recent Labs    06/25/24 2139  INR 1.2    BMP: Recent Labs    06/25/24 1650 06/26/24 0101  NA 137 136  K 4.6 4.4  CL 95* 98  CO2 29 27  GLUCOSE 103* 87  BUN 21 19  CALCIUM 10.1 9.0  CREATININE 1.15* 1.08*  GFRNONAA 53* 57*    LIVER FUNCTION TESTS: Recent Labs    06/25/24 1650 06/26/24 0101  BILITOT 0.6 0.5  AST 25 21  ALT 22 17  ALKPHOS 102 101  PROT 9.0* 7.4  ALBUMIN 3.8 3.0*    TUMOR MARKERS: No results for input(s): AFPTM,  CEA, CA199, CHROMGRNA  in the last 8760 hours.  Assessment and Plan: Diverticular abscess with colovesical fistula IR consulted for aspiration and drainage of diverticular abscess. Case reviewed and approved by Dr. Hughes.  Discussed with patient at bedside.  She understands drain to remain in place until abscess resolved which may be days to weeks depending on the her clinical status and fistulous connection.  She is agreeable to proceed.  She has been NPO.   She is not on blood thinners. INR WNL.   Risks and benefits discussed with the patient including bleeding, infection, damage to adjacent structures, bowel perforation/fistula connection, and sepsis.  All of the patient's questions were answered, patient is agreeable to proceed. Consent signed and in chart.   Thank you for this interesting consult.  I greatly enjoyed meeting LARANDA BURKEMPER and look forward to participating in their care.  A copy of this report was sent to the requesting provider on this date.  Electronically Signed: Mellissa Conley Sue-Ellen Siomara Burkel, PA 06/26/2024, 12:19 PM   I spent a total of 40 Minutes    in face to face in clinical consultation, greater than 50% of which was counseling/coordinating care for diverticular abscess.   "

## 2024-06-26 NOTE — Hospital Course (Addendum)
 The patient is a 64 year old obese Caucasian female with past medical history significant for but not limited to cystocele/rectocele, frequent UTIs, hypothyroidism and other comorbidities who presents with abdominal discomfort and pain along with fevers.  Pain has been going on for 3 weeks and she was instructed by primary care But presents with worsening.  The pain and fever was initially attributed to influenza B and UTI which she was given Macrobid  for.    She continued to have left lower quadrant abdominal discomfort despite antibiotics and went to [facility] and had a CT scan which showed diverticulitis with microperforation and a diverticular abscess along with colovesicular fistula. Transferred to [facility] for Inpt Admission.  General Surgery was consulted and she was given IV antibiotics IV fluids and admitted.  IR was consulted she is s/p LLQ Drain placement. Continued to spike intermittent temperatures so she was given IV fluids and had a blood culture repeated.  Diet is advanced to soft now.  Drain cultures still pending.  The surgical team feels that she can be safely discharged to SNF on oral Augmentin  however given her recent E. coli with resistance in her urine we will await her cultures and sensitivities from her drain to ensure we appropriately send her out on the right antibiotics given her abscess.  Assessment and Plan:  Diverticulitis of Colon with Perforation: Continue IV Cefepime  and Flagyl ; appreciate General Surgery consult and they recommended IR evaluation for drain placement given that the diverticular abscess is 6.9 x 5.3 x 6.4 cm. She is now s/p 43F drainage catheter into the LLQ Abscess (placed 06/26/24) and specimens sent for Gram Stain, Aerobic and Anaerobic Cx -Current Gram Stain + Cx:  Gram Stain FEW WBC PRESENT, PREDOMINANTLY PMN FEW GRAM POSITIVE COCCI FEW GRAM POSITIVE RODS  Culture FEW STREPTOCOCCUS INTERMEDIUS RARE GRAM NEGATIVE RODS CULTURE REINCUBATED FOR BETTER  GROWTH Performed at [facility], [address], [place], [place] [address]  -WBC Improving and went from 15.9 -> 15.4 -> 13.9 -> 12.2 -> 12.6 -Has been Afebrile in the last 24 hours  -Continue IV fluid hydration as below but given her Temperature spikes yesterday Afternoon she was given two 1 Liter boluses. Remains on Soft Diet today. -Continue with supportive care continue with acetaminophen  650 mg p.o./RC every 6 as needed for mild pain, IV fentanyl  12.5 to 50 mcg Q2 as needed for severe pain.  Continue with antiemetics with ondansetron  4 mg p.o./IV every 6 as needed. Further care per General Surgery and IR -General Surgery feels that she can be discharged from their standpoint and patient has had diet progression from full liquid to soft diet.  The surgical team is recommending IR follow-up and IR follow-up is being placed on the chart.  Patient to follow-up with Dr. Evangelina in 3 to 4 weeks and ultimately with one of the colorectal surgeons.  Surgery team feels that she needs at least 2 weeks of antibiotic therapy but after discussion with ID will need to ensure she adequately gets appropriate antibiotic treatment given her cultures. -Will await her final cultures and sensitivities and clearance by the ID team to safely discharge her on the appropriate antibiotic regimen  Recent suspected E. Coli UTI: Urinalysis showed cloudy appearance with moderate hemoglobin, trace ketones, large leukocytes, negative nitrites, 100 protein, urine specific gravity of 1.029, no bacteria seen, greater than 50 WBCs and urine culture showing E. coli 50,000 colony-forming units with resistance to ampicillin , ampicillin  sulbactam and ciprofloxacin.  Getting antibiotics as above should  cover urine   Colo-vesical fistula: CT scan worrisome for colovesicular fistula.  Defer to general surgery for further evaluation and they feel that hopefully the direct diverticulitis will subside with antibiotics, bowel rest and IR  drainage and they are planning for outpatient follow-up with the colorectal surgeon to discuss 1 stage resection and repair -Continue IV Cefepime  and Flagyl  for now and transition to oral Abx when Cx Results    Hypothyroidism: TSH is 2.850. C/w po Levothyroxine  50 mcg po Daily    Normocytic Anemia: Likely dilutional drop from IVF. Hgb/Hct Trend:  Recent Labs  Lab 06/25/24 1650 06/26/24 0101 06/27/24 0505 06/28/24 0458 06/29/24 0830  HGB 11.9* 9.9* 9.9* 9.2* 9.4*  HCT 37.0 30.5* 30.6* 29.9* 29.9*  MCV 86.4 85.2 87.7 89.3 87.9  -Checked Anemia Panel and showed an iron level 16, UIBC 160, TIBC 176, saturation of show 9%, ferritin level 94, folate level greater than 20.0 and Vitamin B12 was 509. CTM for S/Sx of Bleeding; No overt bleeding noted. Repeat CBC in the AM  Renal Insufficiency: Baseline Cr ~1.0. BUN/Cr went from 21/1.15 -> 19/1.08 -> 14/1.08 -> 9/0.87 -> 9/0.76. IVF now stopped. Avoid Nephrotoxic Medications, Contrast Dyes, Hypotension and Dehydration to Ensure Adequate Renal Perfusion and will need to Renally Adjust Meds. CTM & Trend Renal Function carefully & repeat CMP in the AM    Dehydration: Noted and Improved. Mild Renal Insufficiency that is stable and slightly improved; will Rehydrate as above; Follow labs  Thrombocytosis: Likely reactive in the setting of above. Plt Count went from 546 -> 472 -> 458 -> 436 -> 457. CTM & Trend & repeat CBC in the AM   Hypoalbuminemia: Patient's Albumin Lvl went from 3.8 -> 3.0 -> 2.9 -> 2.7 -> 2.9. CTM & Trend & repeat CMP in the AM  Class I Obesity: Complicates overall prognosis and care. Estimated body mass index is 33.33 kg/m as calculated from the following:   Height as of this encounter: 4' 11 (1.499 m).   Weight as of this encounter: 74.8 kg. Weight Loss and Dietary Counseling given

## 2024-06-26 NOTE — Progress Notes (Signed)
 " PROGRESS NOTE    Gwendolyn Sweeney  FMW:[id number] DOB: 1960-10-28 DOA: 06/25/2024 PCP: [name], MD   Brief Narrative:  The patient is a 64 year old obese Caucasian female with past medical history significant for but not limited to cystocele/rectocele, frequent UTIs, hypothyroidism and other comorbidities who presents with abdominal discomfort and pain along with fevers.  Pain has been going on for 3 weeks and she was instructed by primary care But presents with worsening.  The pain and fever was initially attributed to influenza B and UTI which she was given Macrobid  for.  She continued to have left lower quadrant abdominal discomfort despite antibiotics and went to [facility] and went to CT scan which showed diverticulitis with microperforation and a diverticular abscess along with colovesicular fistula.  General surgery was consulted and she was given IV antibiotics IV fluids and admitted.  IR was consulted and planning for drain placement as she is n.p.o.  Assessment and Plan:  Diverticulitis of colon with perforation: Continue IV Cefepime  and Flagyl ; appreciate general surgery consult and they are recommending IR evaluation for drain placement given that the diverticular abscess is 6.9 x 5.3 x 6.4 cm. -WBC went from 15.9 -> 15.4 -Continue IV fluid hydration as below.  Continue bowel rest.  Continue with supportive care continue with acetaminophen  650 mg p.o./RC every 6 as needed for mild pain, IV fentanyl  12.5 to 50 mcg Q2 as needed for severe pain.  Continue with antiemetics with ondansetron  4 mg p.o./IV every 6 as needed. 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  Defer to general surgery for further evaluation and they feel that hopefully the direct diverticulitis will subside with antibiotics, bowel rest and IR drainage and they are planning for outpatient follow-up with the colorectal surgeon to discuss 1 stage  resection and repair -Continue IV Cefepime  and Flagyl  for now   Hypothyroidism: TSH is 2.850. C/w po Levothyroxine  50 mcg po Daily    Normocytic Anemia: Likely dilutional drop from IVF. Hgb/Hct Trend:  Recent Labs  Lab 06/25/24 1650 06/26/24 0101  HGB 11.9* 9.9*  HCT 37.0 30.5*  MCV 86.4 85.2  -Checked Anemia Panel and showed an iron level 16, UIBC 160, TIBC 176, saturation of show 9%, ferritin level 94, folate level greater than 20.0 and Vitamin B12 was 509. CTM for S/Sx of Bleeding; No overt bleeding noted. Repeat CBC in the AM  Renal Insufficiency: Baseline Cr ~1.0. BUN/Cr went from 21/1.15 -> 19/1.08. Renew IVF w/ NS @ 75 mL/hr. Avoid Nephrotoxic Medications, Contrast Dyes, Hypotension and Dehydration to Ensure Adequate Renal Perfusion and will need to Renally Adjust Meds -Continue to Monitor and Trend Renal Function carefully and repeat CMP in the AM    Dehydration: Noted mild Renal Insufficiency; will Rehydrate as above; Follow labs  Thrombocytosis: Likely reactive in the setting of above. Plt Count went from 546 -> 472. CTM & Trend & repeat CBC in the AM   Hypoalbuminemia: Patient's Albumin Lvl went from 3.8 -> 3.0. CTM & Trend & repeat CMP in the AM  Class I Obesity: Complicates overall prognosis and care. Estimated body mass index is 33.33 kg/m as calculated from the following:   Height as of this encounter: 4' 11 (1.499 m).   Weight as of this encounter: 74.8 kg. Weight Loss and Dietary Counseling given   DVT prophylaxis: SCDs Start: 06/26/24 0000    Code Status: Full Code Family Communication: Discussed with husband and son at  bedside  Disposition Plan:  Level of care: Med-Surg Status is: Inpatient Remains inpatient appropriate because: Needs further clinical improvement and clearance by the specialists   Consultants:  General Surgery Interventional Radiology  Procedures:  As delineated as above  Antimicrobials:  Anti-infectives (From admission, onward)     Start     Dose/Rate Route Frequency Ordered Stop   06/26/24 1000  metroNIDAZOLE  (FLAGYL ) IVPB 500 mg       Placed in And Linked Group   500 mg 100 mL/hr over 60 Minutes Intravenous Every 12 hours 06/25/24 2358     06/26/24 0800  ceFEPIme  (MAXIPIME ) 2 g in sodium chloride  0.9 % 100 mL IVPB        2 g 200 mL/hr over 30 Minutes Intravenous Every 12 hours 06/26/24 0002     06/26/24 0045  ceFEPIme  (MAXIPIME ) 2 g in sodium chloride  0.9 % 100 mL IVPB  Status:  Discontinued       Placed in And Linked Group   2 g 200 mL/hr over 30 Minutes Intravenous  Once 06/25/24 2358 06/26/24 0002   06/25/24 2015  ceFEPIme  (MAXIPIME ) 2 g in sodium chloride  0.9 % 100 mL IVPB       Placed in And Linked Group   2 g 200 mL/hr over 30 Minutes Intravenous  Once 06/25/24 2009 06/25/24 2101   06/25/24 2015  metroNIDAZOLE  (FLAGYL ) IVPB 500 mg       Placed in And Linked Group   500 mg 100 mL/hr over 60 Minutes Intravenous  Once 06/25/24 2009 06/25/24 2241       Subjective: Seen and examined at bedside was feeling little bit better but still having some left lower quadrant abdominal discomfort.  Denies any chest pain or shortness of breath.  Feels okay.  Has not been febrile last 24 hours.  No other concerns or complaints at this time but states that she is hungry  Objective: Vitals:   06/25/24 2215 06/25/24 2319 06/26/24 0459 06/26/24 0913  BP: 122/67 135/70 129/69 (!) 158/81  Pulse: (!) 101 94 86 96  Resp: 16 16 16 16   Temp:  99.8 F (37.7 C) 99.4 F (37.4 C) 99.6 F (37.6 C)  TempSrc:  Oral Oral Oral  SpO2: 92% 96% 96% 96%  Weight:      Height:        Intake/Output Summary (Last 24 hours) at 06/26/2024 1219 Last data filed at 06/26/2024 1100 Gross per 24 hour  Intake 641.02 ml  Output 1000 ml  Net -358.98 ml   Filed Weights   06/25/24 1646  Weight: 74.8 kg   Examination: Physical Exam:  Constitutional: WN/WD obese Caucasian female in no acute distress Respiratory: Diminished to  auscultation bilaterally, no wheezing, rales, rhonchi or crackles. Normal respiratory effort and patient is not tachypenic. No accessory muscle use.  Unlabored breathing Cardiovascular: RRR, no murmurs / rubs / gallops. S1 and S2 auscultated. No extremity edema.  Abdomen: Soft, somewhat-tender to palpate on the left lower quadrant, distended secondary to body habitus. Bowel sounds positive.  GU: Deferred. Musculoskeletal: No clubbing / cyanosis of digits/nails. No joint deformity upper and lower extremities.  Skin: No rashes, lesions, ulcers on limited skin evaluation. No induration; Warm and dry.  Neurologic: CN 2-12 grossly intact with no focal deficits.Romberg sign and cerebellar reflexes not assessed.  Psychiatric: Normal judgment and insight. Alert and oriented x 3. Normal mood and appropriate affect.   Data Reviewed: I have personally reviewed following labs and imaging studies  CBC:  Recent Labs  Lab 06/25/24 1650 06/26/24 0101  WBC 15.9* 15.4*  NEUTROABS 13.4*  --   HGB 11.9* 9.9*  HCT 37.0 30.5*  MCV 86.4 85.2  PLT 546* 472*   Basic Metabolic Panel: Recent Labs  Lab 06/25/24 1650 06/26/24 0101  NA 137 136  K 4.6 4.4  CL 95* 98  CO2 29 27  GLUCOSE 103* 87  BUN 21 19  CREATININE 1.15* 1.08*  CALCIUM 10.1 9.0  MG  --  2.1  PHOS  --  4.0   GFR: Estimated Creatinine Clearance: 45.7 mL/min (A) (by C-G formula based on SCr of 1.08 mg/dL (H)). Liver Function Tests: Recent Labs  Lab 06/25/24 1650 06/26/24 0101  AST 25 21  ALT 22 17  ALKPHOS 102 101  BILITOT 0.6 0.5  PROT 9.0* 7.4  ALBUMIN 3.8 3.0*   No results for input(s): LIPASE, AMYLASE in the last 168 hours. No results for input(s): AMMONIA in the last 168 hours. Coagulation Profile: Recent Labs  Lab 06/25/24 2139  INR 1.2   Cardiac Enzymes: No results for input(s): CKTOTAL, CKMB, CKMBINDEX, TROPONINI in the last 168 hours. BNP (last 3 results) No results for input(s): PROBNP in the  last 8760 hours. HbA1C: No results for input(s): HGBA1C in the last 72 hours. CBG: No results for input(s): GLUCAP in the last 168 hours. Lipid Profile: No results for input(s): CHOL, HDL, LDLCALC, TRIG, CHOLHDL, LDLDIRECT in the last 72 hours. Thyroid  Function Tests: Recent Labs    06/26/24 0101  TSH 2.850   Anemia Panel: Recent Labs    06/26/24 0101  VITAMINB12 509  FOLATE >20.0  FERRITIN 904*  TIBC 176*  IRON 16*  RETICCTPCT 0.6   Sepsis Labs: Recent Labs  Lab 06/25/24 1652  LATICACIDVEN 1.2   Recent Results (from the past 240 hours)  Blood culture (routine x 2)     Status: None (Preliminary result)   Collection Time: 06/25/24  6:35 PM   Specimen: BLOOD  Result Value Ref Range Status   Specimen Description   Final    BLOOD RIGHT ANTECUBITAL Performed at [facility], [address], [place], [place] [address]    Special Requests   Final    Blood Culture adequate volume BOTTLES DRAWN AEROBIC AND ANAEROBIC Performed at [facility], [address], [place], [place] [address]    Culture   Final    NO GROWTH < 12 HOURS Performed at [facility], [address] [place] [address] [place], [place] [address]    Report Status PENDING  Incomplete  Blood culture (routine x 2)     Status: None (Preliminary result)   Collection Time: 06/25/24  6:40 PM   Specimen: BLOOD RIGHT HAND  Result Value Ref Range Status   Specimen Description   Final    BLOOD RIGHT HAND Performed at [facility], [address] [place], [place] [address]    Special Requests   Final    Blood Culture adequate volume BOTTLES DRAWN AEROBIC AND ANAEROBIC Performed at [facility], [address] [place], [place] [address]    Culture   Final    NO GROWTH < 12 HOURS Performed at [facility], [address], [place], [place] [address]    Report Status PENDING  Incomplete  Resp panel by RT-PCR (RSV, Flu A&B, Covid)  Anterior Nasal Swab     Status: None   Collection Time: 06/25/24  7:01 PM   Specimen: Anterior Nasal Swab  Result Value Ref Range Status   SARS Coronavirus 2 by RT PCR NEGATIVE NEGATIVE Final    Comment: (NOTE) SARS-CoV-2 target nucleic acids are NOT DETECTED.  The SARS-CoV-2 RNA is generally detectable in upper respiratory specimens during the acute phase of infection. The lowest concentration of SARS-CoV-2 viral copies this assay can detect is 138 copies/mL. A negative result does not preclude SARS-Cov-2 infection and should not be used as the sole basis for treatment or other patient management decisions. A negative result may occur with  improper specimen collection/handling, submission of specimen other than nasopharyngeal swab, presence of viral mutation(s) within the areas targeted by this assay, and inadequate number of viral copies(<138 copies/mL). A negative result must be combined with clinical observations, patient history, and epidemiological information. The expected result is Negative.  Fact Sheet for Patients:  bloggercourse.com  Fact Sheet for Healthcare Providers:  seriousbroker.it  This test is no t yet approved or cleared by the [place]  FDA and  has been authorized for detection and/or diagnosis of SARS-CoV-2 by FDA under an Emergency Use Authorization (EUA). This EUA will remain  in effect (meaning this test can be used) for the duration of the COVID-19 declaration under Section 564(b)(1) of the Act, 21 U.S.C.section 360bbb-3(b)(1), unless the authorization is terminated  or revoked sooner.       Influenza A by PCR NEGATIVE NEGATIVE Final   Influenza B by PCR NEGATIVE NEGATIVE Final    Comment: (NOTE) The Xpert Xpress SARS-CoV-2/FLU/RSV plus assay is intended as an aid in the diagnosis of influenza from Nasopharyngeal swab specimens and should not be used as a sole basis for treatment. Nasal washings  and aspirates are unacceptable for Xpert Xpress SARS-CoV-2/FLU/RSV testing.  Fact Sheet for Patients: bloggercourse.com  Fact Sheet for Healthcare Providers: seriousbroker.it  This test is not yet approved or cleared by the [place]  FDA and has been authorized for detection and/or diagnosis of SARS-CoV-2 by FDA under an Emergency Use Authorization (EUA). This EUA will remain in effect (meaning this test can be used) for the duration of the COVID-19 declaration under Section 564(b)(1) of the Act, 21 U.S.C. section 360bbb-3(b)(1), unless the authorization is terminated or revoked.     Resp Syncytial Virus by PCR NEGATIVE NEGATIVE Final    Comment: (NOTE) Fact Sheet for Patients: bloggercourse.com  Fact Sheet for Healthcare Providers: seriousbroker.it  This test is not yet approved or cleared by the [place]  FDA and has been authorized for detection and/or diagnosis of SARS-CoV-2 by FDA under an Emergency Use Authorization (EUA). This EUA will remain in effect (meaning this test can be used) for the duration of the COVID-19 declaration under Section 564(b)(1) of the Act, 21 U.S.C. section 360bbb-3(b)(1), unless the authorization is terminated or revoked.  Performed at [facility], [address] [place], [place] [address]     Radiology Studies: CT CHEST ABDOMEN PELVIS W CONTRAST Result Date: 06/25/2024 EXAM: CT CHEST WITH CONTRAST 06/25/2024 07:11:57 PM TECHNIQUE: CT of the chest was performed with the administration of intravenous contrast. Multiplanar reformatted images are provided for review. Automated exposure control, iterative reconstruction, and/or weight based adjustment of the mA/kV was utilized to reduce the radiation dose to as low as reasonably achievable. COMPARISON: CT chest 02/01/2022 and CT abdomen and pelvis 05/03/2000. CLINICAL  HISTORY: FINDINGS: MEDIASTINUM: Heart and pericardium are unremarkable. The central airways are clear. LYMPH NODES: No mediastinal, hilar or axillary lymphadenopathy. LUNGS AND PLEURA: There is a linear band of atelectasis or scarring in the  left lower lobe. The lungs are otherwise clear. No pleural effusion or pneumothorax. SOFT TISSUES/BONES: No acute abnormality of the bones or soft tissues. UPPER ABDOMEN: There is wall thickening of the dome of the liver. There is a small amount of air in the bladder. Cannot exclude fistulous connection to sigmoid colon on sagittal image 5/59. There is wall thickening and inflammatory stranding surrounding the sigmoid colon. There is scattered colonic diverticula. There is an adjacent multiloculated enhancing air fluid collection in the left lower quadrant abutting the sigmoid colon measuring 6.9 x 5.3 x 6.4 cm. There is a small amount of microperforation seen on image 2/97. The appendix appears normal. The uterus is surgically absent. Ovaries are not well delineated on this study. IMPRESSION: 1. Acute sigmoid diverticulitis with microperforation and adjacent multiloculated left lower quadrant abscess measuring 6.9 x 5.3 x 6.4 cm. 2. Intraluminal bladder gas and bladder wall thickening superiorly worrisome for reactive cystitis. There is a questionable colovesicular fistula. 3. Note is made that the left ovary is not well seen and therefore, tubo-ovarian abscess would also be in the differential. Electronically signed by: Greig Pique MD 06/25/2024 07:24 PM EST RP Workstation: [id number]   Scheduled Meds:  [START ON 06/27/2024] levothyroxine   50 mcg Oral Q0600   Continuous Infusions:  sodium chloride      ceFEPime  (MAXIPIME ) IV 2 g (06/26/24 0827)   lactated ringers      metronidazole  500 mg (06/26/24 0937)    LOS: 1 day   Alejandro Marker, DO [facility] Available via Epic secure chat 7am-7pm After these hours, please refer to coverage provider listed on  [email or web address] 06/26/2024, 12:19 PM  "

## 2024-06-26 NOTE — Consult Note (Signed)
 Surgical Evaluation Requesting provider: Dr. Mancel Marker  Chief Complaint: abdominal pain  HPI: 64 year old woman with history of hepatic steatosis, cystocele, rectocele, diverticulosis, thyroid  disease, and UTIs who presents with a diverticular abscess and colovesical fistula.  She states that about 3 weeks ago she was not feeling well and was diagnosed with the flu.  She has been having essentially daily fevers, dysuria and fatigue since that time.  After she started taking Tamiflu she noted disrupted in her GI system with lower abdominal pain which was worst about 1 week ago.  She stopped the Tamiflu and then when she was evaluated again was diagnosed with a UTI for which she started antibiotics, which did not resolve her lower abdominal pain.  Currently she reports minimal pain.  She had a small bowel movement this morning and denies any blood or melena.  She does note intermittent pneumaturia over the last few months.  She reports that she is due for colonoscopy but her prior insurance would not authorize this as she intends to get one soon.  Previous abdominal surgery includes hysterectomy, right inguinal hernia repair.  Allergies[1]  Past Medical History:  Diagnosis Date   Fatigue 04/08/2013   Fatty liver 10/11/2016   Pelvic floor relaxation 03/23/2015   Thyroid  disease    Ulcer    Urinary tract bacterial infections     Past Surgical History:  Procedure Laterality Date   ABDOMINAL HYSTERECTOMY     carpel tunnel     COLONOSCOPY N/A 01/20/2016   Procedure: COLONOSCOPY;  Surgeon: Claudis RAYMOND Rivet, MD;  Location: [facility];  Service: Endoscopy;  Laterality: N/A;  830   HERNIA REPAIR     TUBAL LIGATION     WISDOM TOOTH EXTRACTION      Family History  Problem Relation Age of Onset   Heart disease Mother    Heart disease Father    Diabetes Daughter    Diabetes Paternal Grandfather    Heart disease Paternal Grandfather    Macular degeneration Paternal Grandfather    Glaucoma  Maternal Grandmother    Macular degeneration Sister    Other Other        paternal 2nd cousin-tested + for gene for breast cancer    Social History   Socioeconomic History   Marital status: Married    Spouse name: Not on file   Number of children: Not on file   Years of education: Not on file   Highest education level: Not on file  Occupational History   Not on file  Tobacco Use   Smoking status: Never   Smokeless tobacco: Never  Vaping Use   Vaping status: Never Used  Substance and Sexual Activity   Alcohol use: No   Drug use: No   Sexual activity: Yes    Birth control/protection: Surgical    Comment: hyst  Other Topics Concern   Not on file  Social History Narrative   Not on file   Social Drivers of Health   Tobacco Use: Low Risk (06/25/2024)   Patient History    Smoking Tobacco Use: Never    Smokeless Tobacco Use: Never    Passive Exposure: Not on file  Financial Resource Strain: Low Risk (03/16/2023)   Overall Financial Resource Strain (CARDIA)    Difficulty of Paying Living Expenses: Not very hard  Food Insecurity: No Food Insecurity (06/25/2024)   Epic    Worried About Running Out of Food in the Last Year: Never true    Ran Out of  Food in the Last Year: Never true  Transportation Needs: No Transportation Needs (06/25/2024)   Epic    Lack of Transportation (Medical): No    Lack of Transportation (Non-Medical): No  Physical Activity: Inactive (03/16/2023)   Exercise Vital Sign    Days of Exercise per Week: 0 days    Minutes of Exercise per Session: 0 min  Stress: No Stress Concern Present (03/16/2023)   [employer] of Occupational Health - Occupational Stress Questionnaire    Feeling of Stress : Only a little  Social Connections: Socially Integrated (06/25/2024)   Social Connection and Isolation Panel    Frequency of Communication with Friends and Family: More than three times a week    Frequency of Social Gatherings with Friends and Family: More than  three times a week    Attends Religious Services: More than 4 times per year    Active Member of Clubs or Organizations: Yes    Attends Banker Meetings: More than 4 times per year    Marital Status: Married  Depression (PHQ2-9): Low Risk (03/16/2023)   Depression (PHQ2-9)    PHQ-2 Score: 1  Alcohol Screen: Low Risk (03/16/2023)   Alcohol Screen    Last Alcohol Screening Score (AUDIT): 0  Housing: Low Risk (06/25/2024)   Epic    Unable to Pay for Housing in the Last Year: No    Number of Times Moved in the Last Year: 0    Homeless in the Last Year: No  Utilities: Not At Risk (06/25/2024)   Epic    Threatened with loss of utilities: No  Health Literacy: Not on file    Medications Ordered Prior to Encounter[2]  Review of Systems: a complete, 10pt review of systems was completed with pertinent positives and negatives as documented in the HPI  Physical Exam: Vitals:   06/26/24 0459 06/26/24 0913  BP: 129/69 (!) 158/81  Pulse: 86 96  Resp: 16 16  Temp: 99.4 F (37.4 C) 99.6 F (37.6 C)  SpO2: 96% 96%   Gen: A&Ox3, no distress  Chest: respiratory effort is normal.  Cardiovascular: RRR  Gastrointestinal: Obese, soft, nondistended, not particularly tender.  Neuro: No gross deficit Psych: appropriate mood and affect, normal insight/judgment intact  Skin: warm and dry      Latest Ref Rng & Units 06/26/2024    1:01 AM 06/25/2024    4:50 PM 03/23/2015    9:22 AM  CBC  WBC 4.0 - 10.5 K/uL 15.4  15.9  8.4   Hemoglobin 12.0 - 15.0 g/dL 9.9  88.0  86.5   Hematocrit 36.0 - 46.0 % 30.5  37.0  40.8   Platelets 150 - 400 K/uL 472  546  311        Latest Ref Rng & Units 06/26/2024    1:01 AM 06/25/2024    4:50 PM 03/23/2015    9:22 AM  CMP  Glucose 70 - 99 mg/dL 87  896  91   BUN 8 - 23 mg/dL 19  21  16    Creatinine 0.44 - 1.00 mg/dL 8.91  8.84  9.02   Sodium 135 - 145 mmol/L 136  137  141   Potassium 3.5 - 5.1 mmol/L 4.4  4.6  4.4   Chloride 98 - 111 mmol/L 98   95  99   CO2 22 - 32 mmol/L 27  29  27    Calcium 8.9 - 10.3 mg/dL 9.0  89.8  9.5   Total Protein 6.5 -  8.1 g/dL 7.4  9.0  7.3   Total Bilirubin 0.0 - 1.2 mg/dL 0.5  0.6  0.4   Alkaline Phos 38 - 126 U/L 101  102  69   AST 15 - 41 U/L 21  25  30    ALT 0 - 44 U/L 17  22  34     Lab Results  Component Value Date   INR 1.2 06/25/2024    Imaging: CT CHEST ABDOMEN PELVIS W CONTRAST Result Date: 06/25/2024 EXAM: CT CHEST WITH CONTRAST 06/25/2024 07:11:57 PM TECHNIQUE: CT of the chest was performed with the administration of intravenous contrast. Multiplanar reformatted images are provided for review. Automated exposure control, iterative reconstruction, and/or weight based adjustment of the mA/kV was utilized to reduce the radiation dose to as low as reasonably achievable. COMPARISON: CT chest 02/01/2022 and CT abdomen and pelvis 05/03/2000. CLINICAL HISTORY: FINDINGS: MEDIASTINUM: Heart and pericardium are unremarkable. The central airways are clear. LYMPH NODES: No mediastinal, hilar or axillary lymphadenopathy. LUNGS AND PLEURA: There is a linear band of atelectasis or scarring in the left lower lobe. The lungs are otherwise clear. No pleural effusion or pneumothorax. SOFT TISSUES/BONES: No acute abnormality of the bones or soft tissues. UPPER ABDOMEN: There is wall thickening of the dome of the liver. There is a small amount of air in the bladder. Cannot exclude fistulous connection to sigmoid colon on sagittal image 5/59. There is wall thickening and inflammatory stranding surrounding the sigmoid colon. There is scattered colonic diverticula. There is an adjacent multiloculated enhancing air fluid collection in the left lower quadrant abutting the sigmoid colon measuring 6.9 x 5.3 x 6.4 cm. There is a small amount of microperforation seen on image 2/97. The appendix appears normal. The uterus is surgically absent. Ovaries are not well delineated on this study. IMPRESSION: 1. Acute sigmoid  diverticulitis with microperforation and adjacent multiloculated left lower quadrant abscess measuring 6.9 x 5.3 x 6.4 cm. 2. Intraluminal bladder gas and bladder wall thickening superiorly worrisome for reactive cystitis. There is a questionable colovesicular fistula. 3. Note is made that the left ovary is not well seen and therefore, tubo-ovarian abscess would also be in the differential. Electronically signed by: Greig Pique MD 06/25/2024 07:24 PM EST RP Workstation: [id number]     A/P: 64 year old woman presents with 3 weeks of abdominal pain, intermittent fevers and UTI. - Diverticular abscess 6.9 x 5.3 x 6.4 cm: IV antibiotics, bowel rest, IR consult for drain placement. - Colovesical fistula: Hopefully diverticulitis will subside with above treatment and plan will be for outpatient follow-up with colorectal surgeon to discuss 1 stage resection and repair.    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   Hypothyroidism 06/26/2024   Anemia 06/26/2024   Dehydration 06/26/2024   Diverticulitis of colon with perforation 06/25/2024   Cystocele with rectocele 03/16/2023   Encounter for well woman exam with routine gynecological exam 03/16/2023   Urinary frequency 03/16/2023   Encounter for screening fecal occult blood testing 08/10/2020   Visit for pelvic exam 08/10/2020   S/P hysterectomy 08/10/2020   Dysuria 08/10/2020   Hematuria 08/10/2020   Fatty liver 10/11/2016   Rectocele 08/08/2016   Pelvic floor relaxation 03/23/2015   Fatigue 04/08/2013       Mitzie Freund, MD [facility]  See AMION to contact appropriate on-call provider     [1]  Allergies Allergen Reactions   Hydrocodone Itching   Ampicillin  Rash  [2]  No current  facility-administered medications on file prior to encounter.   Current Outpatient Medications on File Prior to Encounter  Medication Sig Dispense Refill   Ascorbic Acid (VITAMIN C PO) Take  by mouth.     benzonatate (TESSALON) 200 MG capsule Take 200 mg by mouth 3 (three) times daily as needed.     levothyroxine  (SYNTHROID ) 50 MCG tablet Take 50 mcg by mouth daily before breakfast.     Multiple Vitamin (MULTIVITAMIN) capsule Take 1 capsule by mouth daily.     cetirizine  (ZYRTEC  ALLERGY) 10 MG tablet Take 1 tablet (10 mg total) by mouth daily. (Patient not taking: Reported on 06/25/2024)     levocetirizine (XYZAL ) 5 MG tablet Take 1 tablet (5 mg total) by mouth every evening. (Patient not taking: Reported on 06/25/2024) 30 tablet 0   sulfamethoxazole -trimethoprim  (BACTRIM  DS) 800-160 MG tablet Take 1 tablet by mouth 2 (two) times daily. Take 1 bid (Patient not taking: Reported on 06/25/2024) 14 tablet 0

## 2024-06-26 NOTE — Assessment & Plan Note (Addendum)
 Continue cefepime  and Flagyl  appreciate general surgery consult Given abscess may need IR consult to drain but defer to general surgery N.p.o. Pain management

## 2024-06-26 NOTE — Procedures (Signed)
 Vascular and Interventional Radiology Procedure Note  Patient: Gwendolyn Sweeney DOB: 1961/02/15 Medical Record Number: [id number] Note Date/Time: 06/26/24 2:26 PM   Performing Physician: Thom Hall, MD Assistant(s): None  Diagnosis: Perforated diverticulitis  Procedure: DRAINAGE CATHETER PLACEMENT into a LLQ ABSCESS  Anesthesia: Conscious Sedation Complications: None Estimated Blood Loss: Minimal Specimens: Sent for Gram Stain, Aerobe Culture, and Anerobe Culture  Findings:  Successful CT-guided placement of 12 F catheter into a LLQ abscess.  Plan:  - Flush drain with 5 mL Normal Saline every 8 hours. - Follow up drain evaluation / sinogram in 7 day(s).  See detailed procedure note with images in PACS. The patient tolerated the procedure well without incident or complication and was returned to Recovery in stable condition.    Thom Hall, MD [facility]   Pager. [phone] Clinic. [phone]

## 2024-06-27 ENCOUNTER — Other Ambulatory Visit (HOSPITAL_COMMUNITY): Payer: Self-pay

## 2024-06-27 DIAGNOSIS — D649 Anemia, unspecified: Secondary | ICD-10-CM | POA: Diagnosis not present

## 2024-06-27 DIAGNOSIS — N321 Vesicointestinal fistula: Secondary | ICD-10-CM | POA: Diagnosis not present

## 2024-06-27 DIAGNOSIS — K5720 Diverticulitis of large intestine with perforation and abscess without bleeding: Secondary | ICD-10-CM | POA: Diagnosis not present

## 2024-06-27 DIAGNOSIS — E860 Dehydration: Secondary | ICD-10-CM | POA: Diagnosis not present

## 2024-06-27 LAB — CBC WITH DIFFERENTIAL/PLATELET
Abs Immature Granulocytes: 0.09 K/uL — ABNORMAL HIGH (ref 0.00–0.07)
Basophils Absolute: 0 K/uL (ref 0.0–0.1)
Basophils Relative: 0 %
Eosinophils Absolute: 0 K/uL (ref 0.0–0.5)
Eosinophils Relative: 0 %
HCT: 30.6 % — ABNORMAL LOW (ref 36.0–46.0)
Hemoglobin: 9.9 g/dL — ABNORMAL LOW (ref 12.0–15.0)
Immature Granulocytes: 1 %
Lymphocytes Relative: 11 %
Lymphs Abs: 1.6 K/uL (ref 0.7–4.0)
MCH: 28.4 pg (ref 26.0–34.0)
MCHC: 32.4 g/dL (ref 30.0–36.0)
MCV: 87.7 fL (ref 80.0–100.0)
Monocytes Absolute: 1 K/uL (ref 0.1–1.0)
Monocytes Relative: 7 %
Neutro Abs: 11.1 K/uL — ABNORMAL HIGH (ref 1.7–7.7)
Neutrophils Relative %: 81 %
Platelets: 458 K/uL — ABNORMAL HIGH (ref 150–400)
RBC: 3.49 MIL/uL — ABNORMAL LOW (ref 3.87–5.11)
RDW: 14.1 % (ref 11.5–15.5)
WBC: 13.9 K/uL — ABNORMAL HIGH (ref 4.0–10.5)
nRBC: 0 % (ref 0.0–0.2)

## 2024-06-27 LAB — MAGNESIUM: Magnesium: 2.2 mg/dL (ref 1.7–2.4)

## 2024-06-27 LAB — COMPREHENSIVE METABOLIC PANEL WITH GFR
ALT: 13 U/L (ref 0–44)
AST: 19 U/L (ref 15–41)
Albumin: 2.9 g/dL — ABNORMAL LOW (ref 3.5–5.0)
Alkaline Phosphatase: 99 U/L (ref 38–126)
Anion gap: 12 (ref 5–15)
BUN: 14 mg/dL (ref 8–23)
CO2: 25 mmol/L (ref 22–32)
Calcium: 9.2 mg/dL (ref 8.9–10.3)
Chloride: 101 mmol/L (ref 98–111)
Creatinine, Ser: 1.08 mg/dL — ABNORMAL HIGH (ref 0.44–1.00)
GFR, Estimated: 57 mL/min — ABNORMAL LOW
Glucose, Bld: 78 mg/dL (ref 70–99)
Potassium: 4.4 mmol/L (ref 3.5–5.1)
Sodium: 137 mmol/L (ref 135–145)
Total Bilirubin: 0.4 mg/dL (ref 0.0–1.2)
Total Protein: 7.1 g/dL (ref 6.5–8.1)

## 2024-06-27 LAB — URINE CULTURE: Culture: 50000 — AB

## 2024-06-27 LAB — PHOSPHORUS: Phosphorus: 4.3 mg/dL (ref 2.5–4.6)

## 2024-06-27 MED ORDER — SODIUM CHLORIDE 0.9 % IV SOLN: INTRAVENOUS | Status: AC

## 2024-06-27 MED ORDER — AMOXICILLIN-POT CLAVULANATE 875-125 MG PO TABS
1.0000 | ORAL_TABLET | Freq: Two times a day (BID) | ORAL | 0 refills | Status: DC
  Filled 2024-06-27: qty 28, 14d supply, fill #0

## 2024-06-27 MED ORDER — SODIUM CHLORIDE 0.9 % IV BOLUS
1000.0000 mL | Freq: Once | INTRAVENOUS | Status: AC
  Administered 2024-06-27: 1000 mL via INTRAVENOUS

## 2024-06-27 MED ORDER — ACETAMINOPHEN 500 MG PO TABS
1000.0000 mg | ORAL_TABLET | Freq: Once | ORAL | Status: AC
  Administered 2024-06-27: 1000 mg via ORAL
  Filled 2024-06-27: qty 2

## 2024-06-27 NOTE — Progress Notes (Addendum)
 "   Referring Provider(s): Dr. Mitzie Freund, MD.  Supervising Physician: Jennefer Rover  Patient Status:  [facility] - In-pt  Chief Complaint: Abdominal pain secondary to diverticulitis of colon with perforation status post drain placement  Brief History: Patient presented to ED on 1/20 with complaints of 3 weeks of abdominal pain and fevers.  She had recently been diagnosed by PCP with influenza B and a UTI, but did not improve with treatment for both, and was advised to present to ED by PCP.  Imaging in the ED was concerning for a diverticular abscess with colovesical fistula.  She has been seen by General surgery who has recommended temporization until eventual resection and repair. IR was consulted for aspiration and drainage, and patient received a drain on 1/21 by Dr. Hughes.  Subjective:  Patient alert and laying in bed, calm.  Her husband is at the bedside. Patient feels mildly improved, but continues with some abdominal tenderness. She remains tired. She is tolerating her drain well, with expected tenderness from recent placement. Patient denies any fevers, headache, chest pain, SOB, cough, abdominal pain, nausea, vomiting or bleeding.   Allergies: Hydrocodone and Ampicillin   Medications: Prior to Admission medications  Medication Sig Start Date End Date Taking? Authorizing Provider  amoxicillin -clavulanate (AUGMENTIN ) 875-125 MG tablet Take 1 tablet by mouth 2 (two) times daily for 14 days. 06/27/24 07/11/24 Yes Freund Mitzie LABOR, MD  Ascorbic Acid (VITAMIN C PO) Take by mouth.   Yes [provider]  benzonatate (TESSALON) 200 MG capsule Take 200 mg by mouth 3 (three) times daily as needed. 06/13/24  Yes [provider]  levothyroxine  (SYNTHROID ) 50 MCG tablet Take 50 mcg by mouth daily before breakfast.   Yes [provider]  Multiple Vitamin (MULTIVITAMIN) capsule Take 1 capsule by mouth daily.   Yes [provider]  cetirizine  (ZYRTEC  ALLERGY) 10 MG  tablet Take 1 tablet (10 mg total) by mouth daily. Patient not taking: Reported on 06/25/2024 01/01/19   Signa Nest A, NP  levocetirizine (XYZAL ) 5 MG tablet Take 1 tablet (5 mg total) by mouth every evening. Patient not taking: Reported on 06/25/2024 05/29/22   Leath-Warren, Etta PARAS, NP  sulfamethoxazole -trimethoprim  (BACTRIM  DS) 800-160 MG tablet Take 1 tablet by mouth 2 (two) times daily. Take 1 bid Patient not taking: Reported on 06/25/2024 04/30/24   Signa Nest LABOR, NP     Vital Signs: BP (!) 142/90 (BP Location: Left Arm)   Pulse 81   Temp (!) 100.4 F (38 C) (Oral)   Resp 18   Ht 4' 11 (1.499 m)   Wt 165 lb (74.8 kg)   SpO2 98%   BMI 33.33 kg/m   Physical Exam Constitutional:      Appearance: Normal appearance.  Cardiovascular:     Rate and Rhythm: Normal rate.  Pulmonary:     Effort: Pulmonary effort is normal.  Abdominal:     General: Abdomen is flat. There is no distension.     Palpations: Abdomen is soft.     Tenderness: There is abdominal tenderness.     Comments: LLQ drain appropriately dressed. Dressing is clean, dry, intact. Drain incision site minimally tender, without evidence of infection. Retaining suture and Stat Lock in place.  Approximately 15 mL of thin, blood-tinged tan output in collection bulb. Line flushes well.     Musculoskeletal:        General: Normal range of motion.  Skin:    General: Skin is warm and dry.  Neurological:  Mental Status: She is alert and oriented to person, place, and time.      Labs:  CBC: Recent Labs    06/25/24 1650 06/26/24 0101 06/27/24 0505  WBC 15.9* 15.4* 13.9*  HGB 11.9* 9.9* 9.9*  HCT 37.0 30.5* 30.6*  PLT 546* 472* 458*    COAGS: Recent Labs    06/25/24 2139  INR 1.2    BMP: Recent Labs    06/25/24 1650 06/26/24 0101 06/27/24 0505  NA 137 136 137  K 4.6 4.4 4.4  CL 95* 98 101  CO2 29 27 25   GLUCOSE 103* 87 78  BUN 21 19 14   CALCIUM 10.1 9.0 9.2  CREATININE 1.15*  1.08* 1.08*  GFRNONAA 53* 57* 57*    LIVER FUNCTION TESTS: Recent Labs    06/25/24 1650 06/26/24 0101 06/27/24 0505  BILITOT 0.6 0.5 0.4  AST 25 21 19   ALT 22 17 13   ALKPHOS 102 101 99  PROT 9.0* 7.4 7.1  ALBUMIN 3.8 3.0* 2.9*    Assessment and Plan: Colonic diverticular abscess status post drain placement.   Drain Location: LLQ Size: Fr size: 12 Fr Date of placement: 06/26/2024  Currently to: Drain collection device: suction bulb 24 hour output:  Output by Drain (mL) 06/25/24 0701 - 06/25/24 1900 06/25/24 1901 - 06/26/24 0700 06/26/24 0701 - 06/26/24 1900 06/26/24 1901 - 06/27/24 0700 06/27/24 0701 - 06/27/24 1322  Closed System Drain 1 Left;Anterior Abdomen    60 40    Interval imaging/drain manipulation:  None.  Current examination: Flushes/aspirates easily.  Insertion site unremarkable. Suture and stat lock in place. Dressed appropriately.   Plan: Patient and her husband were provided an opportunity for drain care education.  Answered patient and her husband's questions with regards to outpatient drain care and follow-up.   Continue TID flushes with 5 cc NS. Record output Q shift. Dressing changes QD or PRN if soiled.  Call IR APP or on call IR MD if difficulty flushing or sudden change in drain output.  Repeat imaging/possible drain injection once output < 10 mL/QD (excluding flush material). Consideration for drain removal if output is < 10 mL/QD (excluding flush material), pending discussion with the providing surgical service.  Discharge planning: Please contact IR APP or on call IR MD prior to patient d/c to ensure appropriate follow up plans are in place. Typically patient will follow up with IR clinic 10-14 days post d/c for repeat imaging/possible drain injection. IR scheduler will contact patient with date/time of appointment. Patient will need to flush drain QD with 5 cc NS, record output QD, dressing changes every 2-3 days or earlier if soiled.   IR  will continue to follow - please call with questions or concerns.     Thank you for this interesting consult.  I greatly enjoyed meeting DEEPIKA DECATUR and look forward to participating in their care.   Electronically Signed: Carlin DELENA Griffon, PA-C 06/27/2024, 1:21 PM     I spent a total of 15 Minutes at the the patient's bedside AND on the patient's hospital floor or unit, greater than 50% of which was counseling/coordinating care for LLQ drain care and follow-up.  "

## 2024-06-27 NOTE — Progress Notes (Signed)
 "    Subjective/Chief Complaint: Still feels fatigued, no abdominal pain   Objective: Vital signs in last 24 hours: Temp:  [98.2 F (36.8 C)-101.7 F (38.7 C)] 98.2 F (36.8 C) (01/22 0516) Pulse Rate:  [79-100] 81 (01/22 0516) Resp:  [13-22] 18 (01/22 0516) BP: (126-149)/(67-90) 142/90 (01/22 0516) SpO2:  [94 %-100 %] 98 % (01/22 0516) Last BM Date : 06/25/24  Intake/Output from previous day: 01/21 0701 - 01/22 0700 In: 1793.9 [P.O.:360; I.V.:1013.9; IV Piggyback:400] Out: 2260 [Urine:2200; Drains:60] Intake/Output this shift: No intake/output data recorded.  A&Ox3, no distress Unlabored respirations Abd s/nt/nd, drain output purulent  Lab Results:  Recent Labs    06/26/24 0101 06/27/24 0505  WBC 15.4* 13.9*  HGB 9.9* 9.9*  HCT 30.5* 30.6*  PLT 472* 458*   BMET Recent Labs    06/26/24 0101 06/27/24 0505  NA 136 137  K 4.4 4.4  CL 98 101  CO2 27 25  GLUCOSE 87 78  BUN 19 14  CREATININE 1.08* 1.08*  CALCIUM 9.0 9.2   PT/INR Recent Labs    06/25/24 2139  LABPROT 15.8*  INR 1.2   ABG No results for input(s): PHART, HCO3 in the last 72 hours.  Invalid input(s): PCO2, PO2  Studies/Results: CT GUIDED PERITONEAL/RETROPERITONEAL FLUID DRAIN BY PERC CATH Result Date: 06/26/2024 INDICATION: 10220 Abscess [id number] Abscess, diverticulitis of colon with perforation EXAM: CT-GUIDED LEFT LOWER QUADRANT ABSCESS DRAINAGE CATHETER PLACEMENT COMPARISON:  CT AP, 06/25/2024 MEDICATIONS: The patient is currently admitted to the hospital and receiving intravenous antibiotics. The antibiotics were administered within an appropriate time frame prior to the initiation of the procedure. ANESTHESIA/SEDATION: Moderate (conscious) sedation was employed during this procedure. A total of Versed  4 mg and Fentanyl  100 mcg was administered intravenously. Moderate Sedation Time: 22 minutes. The patient's level of consciousness and vital signs were monitored continuously by  radiology nursing throughout the procedure under my direct supervision. CONTRAST:  None FLUOROSCOPY TIME:  CT dose; 1155 mGycm COMPLICATIONS: None immediate. PROCEDURE: RADIATION DOSE REDUCTION: This exam was performed according to the departmental dose-optimization program which includes automated exposure control, adjustment of the mA and/or kV according to patient size and/or use of iterative reconstruction technique. Informed written consent was obtained from the patient after a discussion of the risks, benefits and alternatives to treatment. The patient was placed supine on the CT gantry and a pre procedural CT was performed re-demonstrating the known abscess/fluid collection within the location. The procedure was planned. A timeout was performed prior to the initiation of the procedure. The LEFT lower quadrant was prepped and draped in the usual sterile fashion. The overlying soft tissues were anesthetized with 1% lidocaine with epinephrine. Appropriate trajectory was planned with the use of a 22 gauge spinal needle. An 18 gauge trocar needle was advanced into the abscess/fluid collection and a short Amplatz super stiff wire was coiled within the collection. Appropriate positioning was confirmed with a limited CT scan. The tract was serially dilated allowing placement of a 12 Fr Fr drainage catheter. Appropriate positioning was confirmed with a limited postprocedural CT scan. 5 mL of purulent fluid was aspirated. The tube was connected to a drainage bag and sutured in place. A dressing was placed. The patient tolerated the procedure well without immediate post procedural complication. IMPRESSION: Successful CT-guided placement of a 12 Fr drainage catheter into the LEFT lower quadrant with aspiration of 5 mL of purulent fluid. Samples were sent to the laboratory as requested by the ordering clinical team. RECOMMENDATIONS: The  patient will return to [facility] ([facility]) for routine drainage  catheter evaluation and exchange in 7-10 days. Thom Hall, MD [facility] Electronically Signed   By: Thom Hall M.D.   On: 06/26/2024 17:20   CT CHEST ABDOMEN PELVIS W CONTRAST Result Date: 06/25/2024 EXAM: CT CHEST WITH CONTRAST 06/25/2024 07:11:57 PM TECHNIQUE: CT of the chest was performed with the administration of intravenous contrast. Multiplanar reformatted images are provided for review. Automated exposure control, iterative reconstruction, and/or weight based adjustment of the mA/kV was utilized to reduce the radiation dose to as low as reasonably achievable. COMPARISON: CT chest 02/01/2022 and CT abdomen and pelvis 05/03/2000. CLINICAL HISTORY: FINDINGS: MEDIASTINUM: Heart and pericardium are unremarkable. The central airways are clear. LYMPH NODES: No mediastinal, hilar or axillary lymphadenopathy. LUNGS AND PLEURA: There is a linear band of atelectasis or scarring in the left lower lobe. The lungs are otherwise clear. No pleural effusion or pneumothorax. SOFT TISSUES/BONES: No acute abnormality of the bones or soft tissues. UPPER ABDOMEN: There is wall thickening of the dome of the liver. There is a small amount of air in the bladder. Cannot exclude fistulous connection to sigmoid colon on sagittal image 5/59. There is wall thickening and inflammatory stranding surrounding the sigmoid colon. There is scattered colonic diverticula. There is an adjacent multiloculated enhancing air fluid collection in the left lower quadrant abutting the sigmoid colon measuring 6.9 x 5.3 x 6.4 cm. There is a small amount of microperforation seen on image 2/97. The appendix appears normal. The uterus is surgically absent. Ovaries are not well delineated on this study. IMPRESSION: 1. Acute sigmoid diverticulitis with microperforation and adjacent multiloculated left lower quadrant abscess measuring 6.9 x 5.3 x 6.4 cm. 2. Intraluminal bladder gas and bladder  wall thickening superiorly worrisome for reactive cystitis. There is a questionable colovesicular fistula. 3. Note is made that the left ovary is not well seen and therefore, tubo-ovarian abscess would also be in the differential. Electronically signed by: Greig Pique MD 06/25/2024 07:24 PM EST RP Workstation: [id number]    Anti-infectives: Anti-infectives (From admission, onward)    Start     Dose/Rate Route Frequency Ordered Stop   06/27/24 0000  amoxicillin -clavulanate (AUGMENTIN ) 875-125 MG tablet        1 tablet Oral 2 times daily 06/27/24 0850 07/11/24 2359   06/26/24 1000  metroNIDAZOLE  (FLAGYL ) IVPB 500 mg       Placed in And Linked Group   500 mg 100 mL/hr over 60 Minutes Intravenous Every 12 hours 06/25/24 2358     06/26/24 0800  ceFEPIme  (MAXIPIME ) 2 g in sodium chloride  0.9 % 100 mL IVPB        2 g 200 mL/hr over 30 Minutes Intravenous Every 12 hours 06/26/24 0002     06/26/24 0045  ceFEPIme  (MAXIPIME ) 2 g in sodium chloride  0.9 % 100 mL IVPB  Status:  Discontinued       Placed in And Linked Group   2 g 200 mL/hr over 30 Minutes Intravenous  Once 06/25/24 2358 06/26/24 0002   06/25/24 2015  ceFEPIme  (MAXIPIME ) 2 g in sodium chloride  0.9 % 100 mL IVPB       Placed in And Linked Group   2 g 200 mL/hr over 30 Minutes Intravenous  Once 06/25/24 2009 06/25/24 2101   06/25/24 2015  metroNIDAZOLE  (FLAGYL ) IVPB 500 mg       Placed in And Linked Group   500 mg 100 mL/hr over  60 Minutes Intravenous  Once 06/25/24 2009 06/25/24 2241       Assessment/Plan:   64 year old woman presents with 3 weeks of abdominal pain, intermittent fevers and UTI. - Diverticular abscess 6.9 x 5.3 x 6.4 cm: IV antibiotics, IR drain placed 1/21. WBC slightly better today, febrile to 101.7 overnight. 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  Potential DC tomorrow if tolerating soft diet, afebrile, and continued WBC improvement. Abx sent to pharmacy for daughter to pick up in case she does go home in anticipation of possible inclement weather.  Per [facility]: Hypothyroidism obesity Normocytic anemia Renal insufficiency/dehydration Thrombocytosis hypoalbuminemia     LOS: 2 days    Gwendolyn Sweeney 06/27/2024  "

## 2024-06-27 NOTE — Plan of Care (Signed)

## 2024-06-27 NOTE — Progress Notes (Signed)
 " PROGRESS NOTE    Gwendolyn Sweeney  FMW:[id number] DOB: 11-Jan-1961 DOA: 06/25/2024 PCP: [name], MD   Brief Narrative:  The patient is a 64 year old obese Caucasian female with past medical history significant for but not limited to cystocele/rectocele, frequent UTIs, hypothyroidism and other comorbidities who presents with abdominal discomfort and pain along with fevers.  Pain has been going on for 3 weeks and she was instructed by primary care But presents with worsening.  The pain and fever was initially attributed to influenza B and UTI which she was given Macrobid  for.    She continued to have left lower quadrant abdominal discomfort despite antibiotics and went to [facility] and had a CT scan which showed diverticulitis with microperforation and a diverticular abscess along with colovesicular fistula. Transferred to [facility] for Inpt Admission.  General Surgery was consulted and she was given IV antibiotics IV fluids and admitted.  IR was consulted she is s/p LLQ Drain placement. Continues to spike intermittent temperatures so will give her a 1 Liter bolus. Diet advanced to Soft Now.   Assessment and Plan:  Diverticulitis of Colon with Perforation: Continue IV Cefepime  and Flagyl ; appreciate General Surgery consult and they recommended IR evaluation for drain placement given that the diverticular abscess is 6.9 x 5.3 x 6.4 cm. She is now s/p 47F drainage catheter into the LLQ Abscess (placed 06/26/24) and specimens sent for Gram Stain, Aerobic and Anaerobic Cx -Current Gram Stain + Cx:  Gram Stain FEW WBC PRESENT, PREDOMINANTLY PMN FEW GRAM POSITIVE COCCI FEW GRAM POSITIVE RODS  Culture NO GROWTH < 12 HOURS Performed at [facility], [address], [place], [place] [address]  -WBC Improving and went from 15.9 -> 15.4 -> 13.9 -Continues to Spike Temps and had a Tmax of 101.7 in the last 24 hours and most recent Temp was 100.4 -Continue IV fluid hydration as below but given her  Temperature this Afternoon will give her a 1 Liter bolus. CLD advanced to Soft Diet today. -Continue with supportive care continue with acetaminophen  650 mg p.o./RC every 6 as needed for mild pain, IV fentanyl  12.5 to 50 mcg Q2 as needed for severe pain.  Continue with antiemetics with ondansetron  4 mg p.o./IV every 6 as needed. Further care per General Surgery and IR - General Surgery feels that she may be a possible discharge 06/28/2024 if she tolerates her diet, is afebrile and continues to have WBC improvement.  Dr. 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  Defer to general surgery for further evaluation and they feel that hopefully the direct diverticulitis will subside with antibiotics, bowel rest and IR drainage and they are planning for outpatient follow-up with the colorectal surgeon to discuss 1 stage resection and repair -Continue IV Cefepime  and Flagyl  for now   Hypothyroidism: TSH is 2.850. C/w po Levothyroxine  50 mcg po Daily    Normocytic Anemia: Likely dilutional drop from IVF. Hgb/Hct Trend:  Recent Labs  Lab 06/25/24 1650 06/26/24 0101 06/27/24 0505  HGB 11.9* 9.9* 9.9*  HCT 37.0 30.5* 30.6*  MCV 86.4 85.2 87.7  -Checked Anemia Panel and showed an iron level 16, UIBC 160, TIBC 176, saturation of show 9%, ferritin level 94, folate level greater than 20.0 and Vitamin B12 was 509. CTM for S/Sx of Bleeding; No overt bleeding noted. Repeat CBC in the AM  Renal Insufficiency: Baseline Cr ~1.0. BUN/Cr went from  21/1.15 -> 19/1.08 -> 14/1.08. Renew IVF w/ NS @ 75 mL/hr. Avoid Nephrotoxic Medications, Contrast Dyes, Hypotension and Dehydration to Ensure Adequate Renal Perfusion and will need to Renally Adjust Meds -Continue to Monitor and Trend Renal Function carefully and repeat CMP in the AM    Dehydration: Noted mild Renal Insufficiency that is stable and  slightly improved; will Rehydrate as above; Follow labs  Thrombocytosis: Likely reactive in the setting of above. Plt Count went from 546 -> 472 -> 458. CTM & Trend & repeat CBC in the AM   Hypoalbuminemia: Patient's Albumin Lvl went from 3.8 -> 3.0 -> 2.9. CTM & Trend & repeat CMP in the AM  Class I Obesity: Complicates overall prognosis and care. Estimated body mass index is 33.33 kg/m as calculated from the following:   Height as of this encounter: 4' 11 (1.499 m).   Weight as of this encounter: 74.8 kg. Weight Loss and Dietary Counseling given   DVT prophylaxis: SCDs Start: 06/26/24 0000    Code Status: Full Code Family Communication: D/w Son @ bedside   Disposition Plan:  Level of care: Med-Surg Status is: Inpatient Remains inpatient appropriate because: Needs further clinical improvement and clearance by the specialists    Consultants:  General Surgery Interventional Radiology  Procedures:  As delineated as above 5F LLQ Drain placement   Antimicrobials:  Anti-infectives (From admission, onward)    Start     Dose/Rate Route Frequency Ordered Stop   06/27/24 0000  amoxicillin -clavulanate (AUGMENTIN ) 875-125 MG tablet        1 tablet Oral 2 times daily 06/27/24 0850 07/11/24 2359   06/26/24 1000  metroNIDAZOLE  (FLAGYL ) IVPB 500 mg       Placed in And Linked Group   500 mg 100 mL/hr over 60 Minutes Intravenous Every 12 hours 06/25/24 2358     06/26/24 0800  ceFEPIme  (MAXIPIME ) 2 g in sodium chloride  0.9 % 100 mL IVPB        2 g 200 mL/hr over 30 Minutes Intravenous Every 12 hours 06/26/24 0002     06/26/24 0045  ceFEPIme  (MAXIPIME ) 2 g in sodium chloride  0.9 % 100 mL IVPB  Status:  Discontinued       Placed in And Linked Group   2 g 200 mL/hr over 30 Minutes Intravenous  Once 06/25/24 2358 06/26/24 0002   06/25/24 2015  ceFEPIme  (MAXIPIME ) 2 g in sodium chloride  0.9 % 100 mL IVPB       Placed in And Linked Group   2 g 200 mL/hr over 30 Minutes Intravenous   Once 06/25/24 2009 06/25/24 2101   06/25/24 2015  metroNIDAZOLE  (FLAGYL ) IVPB 500 mg       Placed in And Linked Group   500 mg 100 mL/hr over 60 Minutes Intravenous  Once 06/25/24 2009 06/25/24 2241       Subjective: Seen and examined at bedside and was still not feeling very well and was spiking temperatures.  Was also complain of some mild abdominal discomfort and nausea.  Denies any chest pain or shortness of breath.  No lightheadedness or dizziness but very anxious today.  No other concerns or complaints at this time and unfortunately lost her IV so to be replaced by the nursing staff.  Objective: Vitals:   06/27/24 0400 06/27/24 0430 06/27/24 0516 06/27/24 1222  BP:  136/74 (!) 142/90   Pulse:  79 81   Resp:  18 18   Temp: 98.6 F (37 C) 98.4 F (36.9 C)  98.2 F (36.8 C) (!) 100.4 F (38 C)  TempSrc: Oral Oral Oral Oral  SpO2:  95% 98%   Weight:      Height:        Intake/Output Summary (Last 24 hours) at 06/27/2024 1238 Last data filed at 06/27/2024 1229 Gross per 24 hour  Intake 1963.86 ml  Output 2300 ml  Net -336.14 ml   Filed Weights   06/25/24 1646  Weight: 74.8 kg   Examination: Physical Exam:  Constitutional: WN/WD obese Caucasian female who appears appears anxious Respiratory: Diminished to auscultation bilaterally, no wheezing, rales, rhonchi or crackles. Normal respiratory effort and patient is not tachypenic. No accessory muscle use.  Unlabored breathing Cardiovascular: RRR, no murmurs / rubs / gallops. S1 and S2 auscultated. No extremity edema. Abdomen: Soft, tender to palpate and distended secondary body habitus.  Has a left lower quadrant abdominal drain with some purulent discharge. Bowel sounds positive.  GU: Deferred. Musculoskeletal: No clubbing / cyanosis of digits/nails. No joint deformity upper and lower extremities.  Skin: No rashes, lesions, ulcers on a limited skin evaluation. No induration; Warm and dry.  Neurologic: CN 2-12 grossly  intact with no focal deficits. Romberg sign and cerebellar reflexes not assessed.  Psychiatric: Normal judgment and insight. Alert and oriented x 3. Anxious   Data Reviewed: I have personally reviewed following labs and imaging studies  CBC: Recent Labs  Lab 06/25/24 1650 06/26/24 0101 06/27/24 0505  WBC 15.9* 15.4* 13.9*  NEUTROABS 13.4*  --  11.1*  HGB 11.9* 9.9* 9.9*  HCT 37.0 30.5* 30.6*  MCV 86.4 85.2 87.7  PLT 546* 472* 458*   Basic Metabolic Panel: Recent Labs  Lab 06/25/24 1650 06/26/24 0101 06/27/24 0505  NA 137 136 137  K 4.6 4.4 4.4  CL 95* 98 101  CO2 29 27 25   GLUCOSE 103* 87 78  BUN 21 19 14   CREATININE 1.15* 1.08* 1.08*  CALCIUM 10.1 9.0 9.2  MG  --  2.1 2.2  PHOS  --  4.0 4.3   GFR: Estimated Creatinine Clearance: 45.7 mL/min (A) (by C-G formula based on SCr of 1.08 mg/dL (H)). Liver Function Tests: Recent Labs  Lab 06/25/24 1650 06/26/24 0101 06/27/24 0505  AST 25 21 19   ALT 22 17 13   ALKPHOS 102 101 99  BILITOT 0.6 0.5 0.4  PROT 9.0* 7.4 7.1  ALBUMIN 3.8 3.0* 2.9*   No results for input(s): LIPASE, AMYLASE in the last 168 hours. No results for input(s): AMMONIA in the last 168 hours. Coagulation Profile: Recent Labs  Lab 06/25/24 2139  INR 1.2   Cardiac Enzymes: No results for input(s): CKTOTAL, CKMB, CKMBINDEX, TROPONINI in the last 168 hours. BNP (last 3 results) No results for input(s): PROBNP in the last 8760 hours. HbA1C: No results for input(s): HGBA1C in the last 72 hours. CBG: No results for input(s): GLUCAP in the last 168 hours. Lipid Profile: No results for input(s): CHOL, HDL, LDLCALC, TRIG, CHOLHDL, LDLDIRECT in the last 72 hours. Thyroid  Function Tests: Recent Labs    06/26/24 0101  TSH 2.850   Anemia Panel: Recent Labs    06/26/24 0101  VITAMINB12 509  FOLATE >20.0  FERRITIN 904*  TIBC 176*  IRON 16*  RETICCTPCT 0.6   Sepsis Labs: Recent Labs  Lab 06/25/24 1652   LATICACIDVEN 1.2   Recent Results (from the past 240 hours)  Urine Culture     Status: Abnormal   Collection Time: 06/25/24  6:34 PM   Specimen: Urine,  Clean Catch  Result Value Ref Range Status   Specimen Description   Final    URINE, CLEAN CATCH Performed at [facility], [address] [place], [place] [address]    Special Requests   Final    NONE Performed at [facility], [address], [place], [place] [address]    Culture 50,000 COLONIES/mL ESCHERICHIA COLI (A)  Final   Report Status 06/27/2024 FINAL  Final   Organism ID, Bacteria ESCHERICHIA COLI (A)  Final      Susceptibility   Escherichia coli - MIC*    AMPICILLIN  >=32 RESISTANT Resistant     CEFAZOLIN (URINE) Value in next row Sensitive      8 SENSITIVEThis is a modified FDA-approved test that has been validated and its performance characteristics determined by the reporting laboratory.  This laboratory is certified under the Clinical Laboratory Improvement Amendments CLIA as qualified to perform high complexity clinical laboratory testing.    CEFEPIME  Value in next row Sensitive      8 SENSITIVEThis is a modified FDA-approved test that has been validated and its performance characteristics determined by the reporting laboratory.  This laboratory is certified under the Clinical Laboratory Improvement Amendments CLIA as qualified to perform high complexity clinical laboratory testing.    ERTAPENEM Value in next row Sensitive      8 SENSITIVEThis is a modified FDA-approved test that has been validated and its performance characteristics determined by the reporting laboratory.  This laboratory is certified under the Clinical Laboratory Improvement Amendments CLIA as qualified to perform high complexity clinical laboratory testing.    CEFTRIAXONE Value in next row Sensitive      8 SENSITIVEThis is a modified FDA-approved test that has been validated and its performance characteristics  determined by the reporting laboratory.  This laboratory is certified under the Clinical Laboratory Improvement Amendments CLIA as qualified to perform high complexity clinical laboratory testing.    CIPROFLOXACIN Value in next row Resistant      8 SENSITIVEThis is a modified FDA-approved test that has been validated and its performance characteristics determined by the reporting laboratory.  This laboratory is certified under the Clinical Laboratory Improvement Amendments CLIA as qualified to perform high complexity clinical laboratory testing.    GENTAMICIN Value in next row Sensitive      8 SENSITIVEThis is a modified FDA-approved test that has been validated and its performance characteristics determined by the reporting laboratory.  This laboratory is certified under the Clinical Laboratory Improvement Amendments CLIA as qualified to perform high complexity clinical laboratory testing.    NITROFURANTOIN  Value in next row Sensitive      8 SENSITIVEThis is a modified FDA-approved test that has been validated and its performance characteristics determined by the reporting laboratory.  This laboratory is certified under the Clinical Laboratory Improvement Amendments CLIA as qualified to perform high complexity clinical laboratory testing.    TRIMETH /SULFA  Value in next row Sensitive      8 SENSITIVEThis is a modified FDA-approved test that has been validated and its performance characteristics determined by the reporting laboratory.  This laboratory is certified under the Clinical Laboratory Improvement Amendments CLIA as qualified to perform high complexity clinical laboratory testing.    AMPICILLIN /SULBACTAM Value in next row Resistant      8 SENSITIVEThis is a modified FDA-approved test that has been validated and its performance characteristics determined by the reporting laboratory.  This laboratory is certified under the Clinical Laboratory Improvement Amendments CLIA as qualified to perform high  complexity clinical laboratory testing.    PIP/TAZO Value in next row Sensitive      <=4 SENSITIVEThis is a modified FDA-approved test that has been validated and its performance characteristics determined by the reporting laboratory.  This laboratory is certified under the Clinical Laboratory Improvement Amendments CLIA as qualified to perform high complexity clinical laboratory testing.    MEROPENEM Value in next row Sensitive      <=4 SENSITIVEThis is a modified FDA-approved test that has been validated and its performance characteristics determined by the reporting laboratory.  This laboratory is certified under the Clinical Laboratory Improvement Amendments CLIA as qualified to perform high complexity clinical laboratory testing.    * 50,000 COLONIES/mL ESCHERICHIA COLI  Blood culture (routine x 2)     Status: None (Preliminary result)   Collection Time: 06/25/24  6:35 PM   Specimen: BLOOD  Result Value Ref Range Status   Specimen Description   Final    BLOOD RIGHT ANTECUBITAL Performed at [facility], [address], [place], [place] [address]    Special Requests   Final    Blood Culture adequate volume BOTTLES DRAWN AEROBIC AND ANAEROBIC Performed at [facility], [address], [place], [place] [address]    Culture   Final    NO GROWTH 2 DAYS Performed at [facility], [address] [place], [place] [address]    Report Status PENDING  Incomplete  Blood culture (routine x 2)     Status: None (Preliminary result)   Collection Time: 06/25/24  6:40 PM   Specimen: BLOOD RIGHT HAND  Result Value Ref Range Status   Specimen Description   Final    BLOOD RIGHT HAND Performed at [facility], [address] [place], [place] [address]    Special Requests   Final    Blood Culture adequate volume BOTTLES DRAWN AEROBIC AND ANAEROBIC Performed at [facility], [address], [place], [place] [address]    Culture    Final    NO GROWTH 2 DAYS Performed at [facility], [address], [place], [place] [address]    Report Status PENDING  Incomplete  Resp panel by RT-PCR (RSV, Flu A&B, Covid) Anterior Nasal Swab     Status: None   Collection Time: 06/25/24  7:01 PM   Specimen: Anterior Nasal Swab  Result Value Ref Range Status   SARS Coronavirus 2 by RT PCR NEGATIVE NEGATIVE Final    Comment: (NOTE) SARS-CoV-2 target nucleic acids are NOT DETECTED.  The SARS-CoV-2 RNA is generally detectable in upper respiratory specimens during the acute phase of infection. The lowest concentration of SARS-CoV-2 viral copies this assay can detect is 138 copies/mL. A negative result does not preclude SARS-Cov-2 infection and should not be used as the sole basis for treatment or other patient management decisions. A negative result may occur with  improper specimen collection/handling, submission of specimen other than nasopharyngeal swab, presence of viral mutation(s) within the areas targeted by this assay, and inadequate number of viral copies(<138 copies/mL). A negative result must be combined with clinical observations, patient history, and epidemiological information. The expected result is Negative.  Fact Sheet for Patients:  bloggercourse.com  Fact Sheet for Healthcare Providers:  seriousbroker.it  This test is no t yet approved or cleared by the [place]  FDA and  has been authorized for detection and/or diagnosis of SARS-CoV-2 by FDA under an Emergency Use Authorization (EUA). This EUA will remain  in effect (  meaning this test can be used) for the duration of the COVID-19 declaration under Section 564(b)(1) of the Act, 21 U.S.C.section 360bbb-3(b)(1), unless the authorization is terminated  or revoked sooner.       Influenza A by PCR NEGATIVE NEGATIVE Final   Influenza B by PCR NEGATIVE NEGATIVE Final    Comment: (NOTE) The Xpert  Xpress SARS-CoV-2/FLU/RSV plus assay is intended as an aid in the diagnosis of influenza from Nasopharyngeal swab specimens and should not be used as a sole basis for treatment. Nasal washings and aspirates are unacceptable for Xpert Xpress SARS-CoV-2/FLU/RSV testing.  Fact Sheet for Patients: bloggercourse.com  Fact Sheet for Healthcare Providers: seriousbroker.it  This test is not yet approved or cleared by the [place]  FDA and has been authorized for detection and/or diagnosis of SARS-CoV-2 by FDA under an Emergency Use Authorization (EUA). This EUA will remain in effect (meaning this test can be used) for the duration of the COVID-19 declaration under Section 564(b)(1) of the Act, 21 U.S.C. section 360bbb-3(b)(1), unless the authorization is terminated or revoked.     Resp Syncytial Virus by PCR NEGATIVE NEGATIVE Final    Comment: (NOTE) Fact Sheet for Patients: bloggercourse.com  Fact Sheet for Healthcare Providers: seriousbroker.it  This test is not yet approved or cleared by the [place]  FDA and has been authorized for detection and/or diagnosis of SARS-CoV-2 by FDA under an Emergency Use Authorization (EUA). This EUA will remain in effect (meaning this test can be used) for the duration of the COVID-19 declaration under Section 564(b)(1) of the Act, 21 U.S.C. section 360bbb-3(b)(1), unless the authorization is terminated or revoked.  Performed at [facility], [address], [place], [place] [address]   Aerobic/Anaerobic Culture w Gram Stain (surgical/deep wound)     Status: None (Preliminary result)   Collection Time: 06/26/24  2:26 PM   Specimen: Abscess  Result Value Ref Range Status   Specimen Description   Final    ABSCESS Performed at [facility], [address] [place], [place] [address]    Special Requests    Final    NONE Performed at [facility], [address] [place], [place] [address]    Gram Stain   Final    FEW WBC PRESENT, PREDOMINANTLY PMN FEW GRAM POSITIVE COCCI FEW GRAM POSITIVE RODS    Culture   Final    NO GROWTH < 12 HOURS Performed at [facility], [address], [place], [place] [address]    Report Status PENDING  Incomplete    Radiology Studies: CT GUIDED PERITONEAL/RETROPERITONEAL FLUID DRAIN BY PERC CATH Result Date: 06/26/2024 INDICATION: [id number] Abscess [id number] Abscess, diverticulitis of colon with perforation EXAM: CT-GUIDED LEFT LOWER QUADRANT ABSCESS DRAINAGE CATHETER PLACEMENT COMPARISON:  CT AP, 06/25/2024 MEDICATIONS: The patient is currently admitted to the hospital and receiving intravenous antibiotics. The antibiotics were administered within an appropriate time frame prior to the initiation of the procedure. ANESTHESIA/SEDATION: Moderate (conscious) sedation was employed during this procedure. A total of Versed  4 mg and Fentanyl  100 mcg was administered intravenously. Moderate Sedation Time: 22 minutes. The patient's level of consciousness and vital signs were monitored continuously by radiology nursing throughout the procedure under my direct supervision. CONTRAST:  None FLUOROSCOPY TIME:  CT dose; 1155 mGycm COMPLICATIONS: None immediate. PROCEDURE: RADIATION DOSE REDUCTION: This exam was performed according to the departmental dose-optimization program which includes automated exposure control, adjustment of the mA and/or kV according to patient size and/or use of iterative  reconstruction technique. Informed written consent was obtained from the patient after a discussion of the risks, benefits and alternatives to treatment. The patient was placed supine on the CT gantry and a pre procedural CT was performed re-demonstrating the known abscess/fluid collection within the location. The procedure was planned. A timeout was performed prior to the  initiation of the procedure. The LEFT lower quadrant was prepped and draped in the usual sterile fashion. The overlying soft tissues were anesthetized with 1% lidocaine with epinephrine. Appropriate trajectory was planned with the use of a 22 gauge spinal needle. An 18 gauge trocar needle was advanced into the abscess/fluid collection and a short Amplatz super stiff wire was coiled within the collection. Appropriate positioning was confirmed with a limited CT scan. The tract was serially dilated allowing placement of a 12 Fr Fr drainage catheter. Appropriate positioning was confirmed with a limited postprocedural CT scan. 5 mL of purulent fluid was aspirated. The tube was connected to a drainage bag and sutured in place. A dressing was placed. The patient tolerated the procedure well without immediate post procedural complication. IMPRESSION: Successful CT-guided placement of a 12 Fr drainage catheter into the LEFT lower quadrant with aspiration of 5 mL of purulent fluid. Samples were sent to the laboratory as requested by the ordering clinical team. RECOMMENDATIONS: The patient will return to Vascular Interventional Radiology (VIR) for routine drainage catheter evaluation and exchange in 7-10 days. Thom Hall, MD [facility] Electronically Signed   By: Thom Hall M.D.   On: 06/26/2024 17:20   CT CHEST ABDOMEN PELVIS W CONTRAST Result Date: 06/25/2024 EXAM: CT CHEST WITH CONTRAST 06/25/2024 07:11:57 PM TECHNIQUE: CT of the chest was performed with the administration of intravenous contrast. Multiplanar reformatted images are provided for review. Automated exposure control, iterative reconstruction, and/or weight based adjustment of the mA/kV was utilized to reduce the radiation dose to as low as reasonably achievable. COMPARISON: CT chest 02/01/2022 and CT abdomen and pelvis 05/03/2000. CLINICAL HISTORY: FINDINGS: MEDIASTINUM: Heart and pericardium are  unremarkable. The central airways are clear. LYMPH NODES: No mediastinal, hilar or axillary lymphadenopathy. LUNGS AND PLEURA: There is a linear band of atelectasis or scarring in the left lower lobe. The lungs are otherwise clear. No pleural effusion or pneumothorax. SOFT TISSUES/BONES: No acute abnormality of the bones or soft tissues. UPPER ABDOMEN: There is wall thickening of the dome of the liver. There is a small amount of air in the bladder. Cannot exclude fistulous connection to sigmoid colon on sagittal image 5/59. There is wall thickening and inflammatory stranding surrounding the sigmoid colon. There is scattered colonic diverticula. There is an adjacent multiloculated enhancing air fluid collection in the left lower quadrant abutting the sigmoid colon measuring 6.9 x 5.3 x 6.4 cm. There is a small amount of microperforation seen on image 2/97. The appendix appears normal. The uterus is surgically absent. Ovaries are not well delineated on this study. IMPRESSION: 1. Acute sigmoid diverticulitis with microperforation and adjacent multiloculated left lower quadrant abscess measuring 6.9 x 5.3 x 6.4 cm. 2. Intraluminal bladder gas and bladder wall thickening superiorly worrisome for reactive cystitis. There is a questionable colovesicular fistula. 3. Note is made that the left ovary is not well seen and therefore, tubo-ovarian abscess would also be in the differential. Electronically signed by: Greig Pique MD 06/25/2024 07:24 PM EST RP Workstation: [id number]   Scheduled Meds:  levothyroxine   50 mcg Oral Q0600   sodium chloride  flush  5 mL Intracatheter  Q8H   Continuous Infusions:  sodium chloride  75 mL/hr at 06/26/24 2018   ceFEPime  (MAXIPIME ) IV 2 g (06/27/24 0746)   lactated ringers      metronidazole  500 mg (06/27/24 0940)    LOS: 2 days   Alejandro Marker, DO [facility] Available via Epic secure chat 7am-7pm After these hours, please refer to coverage provider listed on  [email or web address] 06/27/2024, 12:38 PM  "

## 2024-06-28 DIAGNOSIS — N321 Vesicointestinal fistula: Secondary | ICD-10-CM | POA: Diagnosis not present

## 2024-06-28 DIAGNOSIS — K5720 Diverticulitis of large intestine with perforation and abscess without bleeding: Secondary | ICD-10-CM | POA: Diagnosis not present

## 2024-06-28 DIAGNOSIS — D649 Anemia, unspecified: Secondary | ICD-10-CM | POA: Diagnosis not present

## 2024-06-28 DIAGNOSIS — E860 Dehydration: Secondary | ICD-10-CM | POA: Diagnosis not present

## 2024-06-28 LAB — COMPREHENSIVE METABOLIC PANEL WITH GFR
ALT: 11 U/L (ref 0–44)
AST: 19 U/L (ref 15–41)
Albumin: 2.7 g/dL — ABNORMAL LOW (ref 3.5–5.0)
Alkaline Phosphatase: 87 U/L (ref 38–126)
Anion gap: 9 (ref 5–15)
BUN: 9 mg/dL (ref 8–23)
CO2: 26 mmol/L (ref 22–32)
Calcium: 8.7 mg/dL — ABNORMAL LOW (ref 8.9–10.3)
Chloride: 104 mmol/L (ref 98–111)
Creatinine, Ser: 0.87 mg/dL (ref 0.44–1.00)
GFR, Estimated: >60 mL/min
Glucose, Bld: 87 mg/dL (ref 70–99)
Potassium: 3.7 mmol/L (ref 3.5–5.1)
Sodium: 139 mmol/L (ref 135–145)
Total Bilirubin: 0.3 mg/dL (ref 0.0–1.2)
Total Protein: 6.5 g/dL (ref 6.5–8.1)

## 2024-06-28 LAB — PHOSPHORUS: Phosphorus: 3 mg/dL (ref 2.5–4.6)

## 2024-06-28 LAB — CBC WITH DIFFERENTIAL/PLATELET
Abs Immature Granulocytes: 0.08 K/uL — ABNORMAL HIGH (ref 0.00–0.07)
Basophils Absolute: 0 K/uL (ref 0.0–0.1)
Basophils Relative: 0 %
Eosinophils Absolute: 0.1 K/uL (ref 0.0–0.5)
Eosinophils Relative: 1 %
HCT: 29.9 % — ABNORMAL LOW (ref 36.0–46.0)
Hemoglobin: 9.2 g/dL — ABNORMAL LOW (ref 12.0–15.0)
Immature Granulocytes: 1 %
Lymphocytes Relative: 12 %
Lymphs Abs: 1.4 K/uL (ref 0.7–4.0)
MCH: 27.5 pg (ref 26.0–34.0)
MCHC: 30.8 g/dL (ref 30.0–36.0)
MCV: 89.3 fL (ref 80.0–100.0)
Monocytes Absolute: 0.8 K/uL (ref 0.1–1.0)
Monocytes Relative: 6 %
Neutro Abs: 9.8 K/uL — ABNORMAL HIGH (ref 1.7–7.7)
Neutrophils Relative %: 80 %
Platelets: 436 K/uL — ABNORMAL HIGH (ref 150–400)
RBC: 3.35 MIL/uL — ABNORMAL LOW (ref 3.87–5.11)
RDW: 13.9 % (ref 11.5–15.5)
WBC: 12.2 K/uL — ABNORMAL HIGH (ref 4.0–10.5)
nRBC: 0 % (ref 0.0–0.2)

## 2024-06-28 LAB — MAGNESIUM: Magnesium: 2.1 mg/dL (ref 1.7–2.4)

## 2024-06-28 MED ORDER — OXYCODONE HCL 5 MG PO TABS
5.0000 mg | ORAL_TABLET | Freq: Four times a day (QID) | ORAL | Status: DC | PRN
  Administered 2024-06-28 – 2024-06-30 (×2): 5 mg via ORAL
  Filled 2024-06-28 (×2): qty 1

## 2024-06-28 NOTE — Progress Notes (Signed)
 "    Subjective/Chief Complaint: Feeling well, but had a fever to 102.4 overnight.  Other than pain around the drain site, has no abdominal pain.  Reports the drain has stopped producing any output.   Objective: Vital signs in last 24 hours: Temp:  [98.3 F (36.8 C)-102.4 F (39.1 C)] 98.3 F (36.8 C) (01/23 0505) Pulse Rate:  [79-93] 79 (01/23 0505) Resp:  [16-17] 17 (01/23 0505) BP: (132-144)/(64-78) 144/75 (01/23 0505) SpO2:  [96 %-98 %] 96 % (01/23 0505) Last BM Date : 06/25/24  Intake/Output from previous day: 01/22 0701 - 01/23 0700 In: 3211.7 [P.O.:780; I.V.:1108.7; IV Piggyback:1303] Out: 675 [Urine:600; Drains:75] Intake/Output this shift: Total I/O In: 5 [I.V.:5] Out: -   A&Ox3, no distress Unlabored respirations Abd s/nt/nd, drain output purulent  Lab Results:  Recent Labs    06/27/24 0505 06/28/24 0458  WBC 13.9* 12.2*  HGB 9.9* 9.2*  HCT 30.6* 29.9*  PLT 458* 436*   BMET Recent Labs    06/27/24 0505 06/28/24 0458  NA 137 139  K 4.4 3.7  CL 101 104  CO2 25 26  GLUCOSE 78 87  BUN 14 9  CREATININE 1.08* 0.87  CALCIUM 9.2 8.7*   PT/INR Recent Labs    06/25/24 2139  LABPROT 15.8*  INR 1.2   ABG No results for input(s): PHART, HCO3 in the last 72 hours.  Invalid input(s): PCO2, PO2  Studies/Results: CT GUIDED PERITONEAL/RETROPERITONEAL FLUID DRAIN BY PERC CATH Result Date: 06/26/2024 INDICATION: 10220 Abscess [id number] Abscess, diverticulitis of colon with perforation EXAM: CT-GUIDED LEFT LOWER QUADRANT ABSCESS DRAINAGE CATHETER PLACEMENT COMPARISON:  CT AP, 06/25/2024 MEDICATIONS: The patient is currently admitted to the hospital and receiving intravenous antibiotics. The antibiotics were administered within an appropriate time frame prior to the initiation of the procedure. ANESTHESIA/SEDATION: Moderate (conscious) sedation was employed during this procedure. A total of Versed  4 mg and Fentanyl  100 mcg was administered intravenously.  Moderate Sedation Time: 22 minutes. The patient's level of consciousness and vital signs were monitored continuously by radiology nursing throughout the procedure under my direct supervision. CONTRAST:  None FLUOROSCOPY TIME:  CT dose; 1155 mGycm COMPLICATIONS: None immediate. PROCEDURE: RADIATION DOSE REDUCTION: This exam was performed according to the departmental dose-optimization program which includes automated exposure control, adjustment of the mA and/or kV according to patient size and/or use of iterative reconstruction technique. Informed written consent was obtained from the patient after a discussion of the risks, benefits and alternatives to treatment. The patient was placed supine on the CT gantry and a pre procedural CT was performed re-demonstrating the known abscess/fluid collection within the location. The procedure was planned. A timeout was performed prior to the initiation of the procedure. The LEFT lower quadrant was prepped and draped in the usual sterile fashion. The overlying soft tissues were anesthetized with 1% lidocaine with epinephrine. Appropriate trajectory was planned with the use of a 22 gauge spinal needle. An 18 gauge trocar needle was advanced into the abscess/fluid collection and a short Amplatz super stiff wire was coiled within the collection. Appropriate positioning was confirmed with a limited CT scan. The tract was serially dilated allowing placement of a 12 Fr Fr drainage catheter. Appropriate positioning was confirmed with a limited postprocedural CT scan. 5 mL of purulent fluid was aspirated. The tube was connected to a drainage bag and sutured in place. A dressing was placed. The patient tolerated the procedure well without immediate post procedural complication. IMPRESSION: Successful CT-guided placement of a 12 Fr drainage catheter  into the LEFT lower quadrant with aspiration of 5 mL of purulent fluid. Samples were sent to the laboratory as requested by the ordering  clinical team. RECOMMENDATIONS: The patient will return to V[facility]iology (VIR) for routine drainage catheter evaluation and exchange in 7-10 days. Thom Hall, MD [facility] Radiology Electronically Signed   By: Thom Hall M.D.   On: 06/26/2024 17:20    Anti-infectives: Anti-infectives (From admission, onward)    Start     Dose/Rate Route Frequency Ordered Stop   06/27/24 0000  amoxicillin -clavulanate (AUGMENTIN ) 875-125 MG tablet        1 tablet Oral 2 times daily 06/27/24 0850 07/11/24 2359   06/26/24 1000  metroNIDAZOLE  (FLAGYL ) IVPB 500 mg       Placed in And Linked Group   500 mg 100 mL/hr over 60 Minutes Intravenous Every 12 hours 06/25/24 2358     06/26/24 0800  ceFEPIme  (MAXIPIME ) 2 g in sodium chloride  0.9 % 100 mL IVPB        2 g 200 mL/hr over 30 Minutes Intravenous Every 12 hours 06/26/24 0002     06/26/24 0045  ceFEPIme  (MAXIPIME ) 2 g in sodium chloride  0.9 % 100 mL IVPB  Status:  Discontinued       Placed in And Linked Group   2 g 200 mL/hr over 30 Minutes Intravenous  Once 06/25/24 2358 06/26/24 0002   06/25/24 2015  ceFEPIme  (MAXIPIME ) 2 g in sodium chloride  0.9 % 100 mL IVPB       Placed in And Linked Group   2 g 200 mL/hr over 30 Minutes Intravenous  Once 06/25/24 2009 06/25/24 2101   06/25/24 2015  metroNIDAZOLE  (FLAGYL ) IVPB 500 mg       Placed in And Linked Group   500 mg 100 mL/hr over 60 Minutes Intravenous  Once 06/25/24 2009 06/25/24 2241       Assessment/Plan:   64 year old woman presents with 3 weeks of abdominal pain, intermittent fevers and UTI. - Diverticular abscess 6.9 x 5.3 x 6.4 cm: IV antibiotics, IR drain placed 1/21. WBC slightly better today, febrile to 102.4 overnight.  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  Discussed would like to see her afebrile for 24 hours before she leaves the hospital, will see how the day progresses today.  Per [facility]: Hypothyroidism obesity Normocytic anemia Renal insufficiency/dehydration Thrombocytosis hypoalbuminemia     LOS: 3 days    Gwendolyn Sweeney 06/28/2024  "

## 2024-06-28 NOTE — Progress Notes (Signed)
 " PROGRESS NOTE    Gwendolyn Sweeney  FMW:[id number] DOB: 09/12/1960 DOA: 06/25/2024 PCP: [name], MD   Brief Narrative:  The patient is a 64 year old obese Caucasian female with past medical history significant for but not limited to cystocele/rectocele, frequent UTIs, hypothyroidism and other comorbidities who presents with abdominal discomfort and pain along with fevers.  Pain has been going on for 3 weeks and she was instructed by primary care But presents with worsening.  The pain and fever was initially attributed to influenza B and UTI which she was given Macrobid  for.    She continued to have left lower quadrant abdominal discomfort despite antibiotics and went to [facility] and had a CT scan which showed diverticulitis with microperforation and a diverticular abscess along with colovesicular fistula. Transferred to [facility] for Inpt Admission.  General Surgery was consulted and she was given IV antibiotics IV fluids and admitted.  IR was consulted she is s/p LLQ Drain placement. Continues to spike intermittent temperatures so will give her a 1 Liter bolus. Diet advanced to Soft Now.   Assessment and Plan:  Diverticulitis of Colon with Perforation: Continue IV Cefepime  and Flagyl ; appreciate General Surgery consult and they recommended IR evaluation for drain placement given that the diverticular abscess is 6.9 x 5.3 x 6.4 cm. She is now s/p 106F drainage catheter into the LLQ Abscess (placed 06/26/24) and specimens sent for Gram Stain, Aerobic and Anaerobic Cx -Current Gram Stain + Cx:  Gram Stain FEW WBC PRESENT, PREDOMINANTLY PMN FEW GRAM POSITIVE COCCI FEW GRAM POSITIVE RODS  Culture NO GROWTH < 12 HOURS Performed at [facility], [address], [place], [place] [address]  -WBC Improving and went from 15.9 -> 15.4 -> 13.9 -Continues to Spike Temps and had a Tmax of 102.4  in the last 24 hours and most recent Temp was 98.4 -Continue IV fluid hydration as below but given her  Temperature spikes yesterday Afternoon she was given two 1 Liter boluses. Remains on Soft Diet today. -Continue with supportive care continue with acetaminophen  650 mg p.o./RC every 6 as needed for mild pain, IV fentanyl  12.5 to 50 mcg Q2 as needed for severe pain.  Continue with antiemetics with ondansetron  4 mg p.o./IV every 6 as needed. Further care per General Surgery and IR - General Surgery feels that she may be a possible discharge 06/28/2024 if she tolerates her diet, is afebrile and continues to have WBC improvement.  Dr. 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  Defer to general surgery for further evaluation and they feel that hopefully the direct diverticulitis will subside with antibiotics, bowel rest and IR drainage and they are planning for outpatient follow-up with the colorectal surgeon to discuss 1 stage resection and repair -Continue IV Cefepime  and Flagyl  for now   Hypothyroidism: TSH is 2.850. C/w po Levothyroxine  50 mcg po Daily    Normocytic Anemia: Likely dilutional drop from IVF. Hgb/Hct Trend:  Recent Labs  Lab 06/25/24 1650 06/26/24 0101 06/27/24 0505 06/28/24 0458  HGB 11.9* 9.9* 9.9* 9.2*  HCT 37.0 30.5* 30.6* 29.9*  MCV 86.4 85.2 87.7 89.3  -Checked Anemia Panel and showed an iron level 16, UIBC 160, TIBC 176, saturation of show 9%, ferritin level 94, folate level greater than 20.0 and Vitamin B12 was 509. CTM for S/Sx of Bleeding; No overt bleeding noted. Repeat CBC in the AM  Renal Insufficiency:  Baseline Cr ~1.0. BUN/Cr went from 21/1.15 -> 19/1.08 -> 14/1.08 -> 9/0.87. Renewed IVF w/ NS @ 75 mL/hr x 1 Day. Avoid Nephrotoxic Medications, Contrast Dyes, Hypotension and Dehydration to Ensure Adequate Renal Perfusion and will need to Renally Adjust Meds. CTM & Trend Renal Function carefully & repeat CMP in the AM    Dehydration: Noted mild Renal  Insufficiency that is stable and slightly improved; will Rehydrate as above; Follow labs  Thrombocytosis: Likely reactive in the setting of above. Plt Count went from 546 -> 472 -> 458 -> 436. CTM & Trend & repeat CBC in the AM   Hypoalbuminemia: Patient's Albumin Lvl went from 3.8 -> 3.0 -> 2.9 -> 2.7. CTM & Trend & repeat CMP in the AM  Class I Obesity: Complicates overall prognosis and care. Estimated body mass index is 33.33 kg/m as calculated from the following:   Height as of this encounter: 4' 11 (1.499 m).   Weight as of this encounter: 74.8 kg. Weight Loss and Dietary Counseling given   DVT prophylaxis: SCDs Start: 06/26/24 0000    Code Status: Full Code Family Communication: D/w Husband @ bedside  Disposition Plan:  Level of care: Med-Surg Status is: Inpatient Remains inpatient appropriate because: Anticipating D/C in the next 24 hours if afebrile   Consultants:  General Surgery Interventional Radiology  Procedures:  As delineated as above 25F LLQ Drain placement   Antimicrobials:  Anti-infectives (From admission, onward)    Start     Dose/Rate Route Frequency Ordered Stop   06/27/24 0000  amoxicillin -clavulanate (AUGMENTIN ) 875-125 MG tablet        1 tablet Oral 2 times daily 06/27/24 0850 07/11/24 2359   06/26/24 1000  metroNIDAZOLE  (FLAGYL ) IVPB 500 mg       Placed in And Linked Group   500 mg 100 mL/hr over 60 Minutes Intravenous Every 12 hours 06/25/24 2358     06/26/24 0800  ceFEPIme  (MAXIPIME ) 2 g in sodium chloride  0.9 % 100 mL IVPB        2 g 200 mL/hr over 30 Minutes Intravenous Every 12 hours 06/26/24 0002     06/26/24 0045  ceFEPIme  (MAXIPIME ) 2 g in sodium chloride  0.9 % 100 mL IVPB  Status:  Discontinued       Placed in And Linked Group   2 g 200 mL/hr over 30 Minutes Intravenous  Once 06/25/24 2358 06/26/24 0002   06/25/24 2015  ceFEPIme  (MAXIPIME ) 2 g in sodium chloride  0.9 % 100 mL IVPB       Placed in And Linked Group   2 g 200 mL/hr  over 30 Minutes Intravenous  Once 06/25/24 2009 06/25/24 2101   06/25/24 2015  metroNIDAZOLE  (FLAGYL ) IVPB 500 mg       Placed in And Linked Group   500 mg 100 mL/hr over 60 Minutes Intravenous  Once 06/25/24 2009 06/25/24 2241       Subjective: Seen and examined at bedside states that she is feeling better after taking a shower today.  States her drain was not really working yesterday but now putting out more.  No nausea or vomiting.  Still has some abdominal discomfort.  No other concerns or complaint at this time.  Objective: Vitals:   06/28/24 0335 06/28/24 0505 06/28/24 1152 06/28/24 1251  BP:  (!) 144/75  (!) 141/79  Pulse:  79  80  Resp:  17  16  Temp: 98.4 F (36.9 C) 98.3 F (36.8 C) 98.8 F (37.1 C) 98.4  F (36.9 C)  TempSrc: Oral Oral Oral   SpO2:  96%  97%  Weight:      Height:        Intake/Output Summary (Last 24 hours) at 06/28/2024 1651 Last data filed at 06/28/2024 1500 Gross per 24 hour  Intake 2631.11 ml  Output 35 ml  Net 2596.11 ml   Filed Weights   06/25/24 1646  Weight: 74.8 kg   Examination: Physical Exam:  Constitutional: WN/WD obese Caucasian female who appears calm Respiratory: Diminished to auscultation bilaterally, no wheezing, rales, rhonchi or crackles. Normal respiratory effort and patient is not tachypenic. No accessory muscle use.  Unlabored breathing Cardiovascular: RRR, no murmurs / rubs / gallops. S1 and S2 auscultated. No extremity edema. Abdomen: Soft, slightly-tender, distended secondary body habitus. Has abdominal drain in place. Bowel sounds positive.  GU: Deferred. Musculoskeletal: No clubbing / cyanosis of digits/nails. No joint deformity upper and lower extremities.  Skin: No rashes, lesions, ulcers on a limited skin evaluation. No induration; Warm and dry.  Neurologic: CN 2-12 grossly intact with no focal deficits. Romberg sign and cerebellar reflexes not assessed.  Psychiatric: Normal judgment and insight. Alert and  oriented x 3. Normal mood and appropriate affect.   Data Reviewed: I have personally reviewed following labs and imaging studies  CBC: Recent Labs  Lab 06/25/24 1650 06/26/24 0101 06/27/24 0505 06/28/24 0458  WBC 15.9* 15.4* 13.9* 12.2*  NEUTROABS 13.4*  --  11.1* 9.8*  HGB 11.9* 9.9* 9.9* 9.2*  HCT 37.0 30.5* 30.6* 29.9*  MCV 86.4 85.2 87.7 89.3  PLT 546* 472* 458* 436*   Basic Metabolic Panel: Recent Labs  Lab 06/25/24 1650 06/26/24 0101 06/27/24 0505 06/28/24 0458  NA 137 136 137 139  K 4.6 4.4 4.4 3.7  CL 95* 98 101 104  CO2 29 27 25 26   GLUCOSE 103* 87 78 87  BUN 21 19 14 9   CREATININE 1.15* 1.08* 1.08* 0.87  CALCIUM 10.1 9.0 9.2 8.7*  MG  --  2.1 2.2 2.1  PHOS  --  4.0 4.3 3.0   GFR: Estimated Creatinine Clearance: 56.8 mL/min (by C-G formula based on SCr of 0.87 mg/dL). Liver Function Tests: Recent Labs  Lab 06/25/24 1650 06/26/24 0101 06/27/24 0505 06/28/24 0458  AST 25 21 19 19   ALT 22 17 13 11   ALKPHOS 102 101 99 87  BILITOT 0.6 0.5 0.4 0.3  PROT 9.0* 7.4 7.1 6.5  ALBUMIN 3.8 3.0* 2.9* 2.7*   No results for input(s): LIPASE, AMYLASE in the last 168 hours. No results for input(s): AMMONIA in the last 168 hours. Coagulation Profile: Recent Labs  Lab 06/25/24 2139  INR 1.2   Cardiac Enzymes: No results for input(s): CKTOTAL, CKMB, CKMBINDEX, TROPONINI in the last 168 hours. BNP (last 3 results) No results for input(s): PROBNP in the last 8760 hours. HbA1C: No results for input(s): HGBA1C in the last 72 hours. CBG: No results for input(s): GLUCAP in the last 168 hours. Lipid Profile: No results for input(s): CHOL, HDL, LDLCALC, TRIG, CHOLHDL, LDLDIRECT in the last 72 hours. Thyroid  Function Tests: Recent Labs    06/26/24 0101  TSH 2.850   Anemia Panel: Recent Labs    06/26/24 0101  VITAMINB12 509  FOLATE >20.0  FERRITIN 904*  TIBC 176*  IRON 16*  RETICCTPCT 0.6   Sepsis Labs: Recent Labs   Lab 06/25/24 1652  LATICACIDVEN 1.2   Recent Results (from the past 240 hours)  Urine Culture     Status:  Abnormal   Collection Time: 06/25/24  6:34 PM   Specimen: Urine, Clean Catch  Result Value Ref Range Status   Specimen Description   Final    URINE, CLEAN CATCH Performed at [facility], [address] [place], [place] [address]    Special Requests   Final    NONE Performed at [facility], [address] [place], [place] [address]    Culture 50,000 COLONIES/mL ESCHERICHIA COLI (A)  Final   Report Status 06/27/2024 FINAL  Final   Organism ID, Bacteria ESCHERICHIA COLI (A)  Final      Susceptibility   Escherichia coli - MIC*    AMPICILLIN  >=32 RESISTANT Resistant     CEFAZOLIN (URINE) Value in next row Sensitive      8 SENSITIVEThis is a modified FDA-approved test that has been validated and its performance characteristics determined by the reporting laboratory.  This laboratory is certified under the Clinical Laboratory Improvement Amendments CLIA as qualified to perform high complexity clinical laboratory testing.    CEFEPIME  Value in next row Sensitive      8 SENSITIVEThis is a modified FDA-approved test that has been validated and its performance characteristics determined by the reporting laboratory.  This laboratory is certified under the Clinical Laboratory Improvement Amendments CLIA as qualified to perform high complexity clinical laboratory testing.    ERTAPENEM Value in next row Sensitive      8 SENSITIVEThis is a modified FDA-approved test that has been validated and its performance characteristics determined by the reporting laboratory.  This laboratory is certified under the Clinical Laboratory Improvement Amendments CLIA as qualified to perform high complexity clinical laboratory testing.    CEFTRIAXONE  Value in next row Sensitive      8 SENSITIVEThis is a modified FDA-approved test that has been validated and its  performance characteristics determined by the reporting laboratory.  This laboratory is certified under the Clinical Laboratory Improvement Amendments CLIA as qualified to perform high complexity clinical laboratory testing.    CIPROFLOXACIN Value in next row Resistant      8 SENSITIVEThis is a modified FDA-approved test that has been validated and its performance characteristics determined by the reporting laboratory.  This laboratory is certified under the Clinical Laboratory Improvement Amendments CLIA as qualified to perform high complexity clinical laboratory testing.    GENTAMICIN Value in next row Sensitive      8 SENSITIVEThis is a modified FDA-approved test that has been validated and its performance characteristics determined by the reporting laboratory.  This laboratory is certified under the Clinical Laboratory Improvement Amendments CLIA as qualified to perform high complexity clinical laboratory testing.    NITROFURANTOIN  Value in next row Sensitive      8 SENSITIVEThis is a modified FDA-approved test that has been validated and its performance characteristics determined by the reporting laboratory.  This laboratory is certified under the Clinical Laboratory Improvement Amendments CLIA as qualified to perform high complexity clinical laboratory testing.    TRIMETH /SULFA  Value in next row Sensitive      8 SENSITIVEThis is a modified FDA-approved test that has been validated and its performance characteristics determined by the reporting laboratory.  This laboratory is certified under the Clinical Laboratory Improvement Amendments CLIA as qualified to perform high complexity clinical laboratory testing.    AMPICILLIN /SULBACTAM Value in next row Resistant      8 SENSITIVEThis is a modified FDA-approved test that has been validated and its performance characteristics determined by the reporting laboratory.  This laboratory is certified  under the Clinical Laboratory Improvement Amendments CLIA as  qualified to perform high complexity clinical laboratory testing.    PIP/TAZO Value in next row Sensitive      <=4 SENSITIVEThis is a modified FDA-approved test that has been validated and its performance characteristics determined by the reporting laboratory.  This laboratory is certified under the Clinical Laboratory Improvement Amendments CLIA as qualified to perform high complexity clinical laboratory testing.    MEROPENEM Value in next row Sensitive      <=4 SENSITIVEThis is a modified FDA-approved test that has been validated and its performance characteristics determined by the reporting laboratory.  This laboratory is certified under the Clinical Laboratory Improvement Amendments CLIA as qualified to perform high complexity clinical laboratory testing.    * 50,000 COLONIES/mL ESCHERICHIA COLI  Blood culture (routine x 2)     Status: None (Preliminary result)   Collection Time: 06/25/24  6:35 PM   Specimen: BLOOD  Result Value Ref Range Status   Specimen Description   Final    BLOOD RIGHT ANTECUBITAL Performed at [facility], [address] [place], [place] [address]    Special Requests   Final    Blood Culture adequate volume BOTTLES DRAWN AEROBIC AND ANAEROBIC Performed at [facility], [address] [place], [place] [address]    Culture   Final    NO GROWTH 3 DAYS Performed at [facility], [address], [place], [place] [address]    Report Status PENDING  Incomplete  Blood culture (routine x 2)     Status: None (Preliminary result)   Collection Time: 06/25/24  6:40 PM   Specimen: BLOOD RIGHT HAND  Result Value Ref Range Status   Specimen Description   Final    BLOOD RIGHT HAND Performed at [facility], [address], [place], [place] [address]    Special Requests   Final    Blood Culture adequate volume BOTTLES DRAWN AEROBIC AND ANAEROBIC Performed at [facility], [address],  [place], [place] [address]    Culture   Final    NO GROWTH 3 DAYS Performed at [facility], [address], [place], [place] [address]    Report Status PENDING  Incomplete  Resp panel by RT-PCR (RSV, Flu A&B, Covid) Anterior Nasal Swab     Status: None   Collection Time: 06/25/24  7:01 PM   Specimen: Anterior Nasal Swab  Result Value Ref Range Status   SARS Coronavirus 2 by RT PCR NEGATIVE NEGATIVE Final    Comment: (NOTE) SARS-CoV-2 target nucleic acids are NOT DETECTED.  The SARS-CoV-2 RNA is generally detectable in upper respiratory specimens during the acute phase of infection. The lowest concentration of SARS-CoV-2 viral copies this assay can detect is 138 copies/mL. A negative result does not preclude SARS-Cov-2 infection and should not be used as the sole basis for treatment or other patient management decisions. A negative result may occur with  improper specimen collection/handling, submission of specimen other than nasopharyngeal swab, presence of viral mutation(s) within the areas targeted by this assay, and inadequate number of viral copies(<138 copies/mL). A negative result must be combined with clinical observations, patient history, and epidemiological information. The expected result is Negative.  Fact Sheet for Patients:  bloggercourse.com  Fact Sheet for Healthcare Providers:  seriousbroker.it  This test is no t yet approved or cleared by the [place]  FDA and  has been authorized for detection and/or diagnosis of SARS-CoV-2 by FDA  under an Emergency Use Authorization (EUA). This EUA will remain  in effect (meaning this test can be used) for the duration of the COVID-19 declaration under Section 564(b)(1) of the Act, 21 U.S.C.section 360bbb-3(b)(1), unless the authorization is terminated  or revoked sooner.       Influenza A by PCR NEGATIVE NEGATIVE Final   Influenza B by PCR NEGATIVE NEGATIVE Final     Comment: (NOTE) The Xpert Xpress SARS-CoV-2/FLU/RSV plus assay is intended as an aid in the diagnosis of influenza from Nasopharyngeal swab specimens and should not be used as a sole basis for treatment. Nasal washings and aspirates are unacceptable for Xpert Xpress SARS-CoV-2/FLU/RSV testing.  Fact Sheet for Patients: bloggercourse.com  Fact Sheet for Healthcare Providers: seriousbroker.it  This test is not yet approved or cleared by the [place]  FDA and has been authorized for detection and/or diagnosis of SARS-CoV-2 by FDA under an Emergency Use Authorization (EUA). This EUA will remain in effect (meaning this test can be used) for the duration of the COVID-19 declaration under Section 564(b)(1) of the Act, 21 U.S.C. section 360bbb-3(b)(1), unless the authorization is terminated or revoked.     Resp Syncytial Virus by PCR NEGATIVE NEGATIVE Final    Comment: (NOTE) Fact Sheet for Patients: bloggercourse.com  Fact Sheet for Healthcare Providers: seriousbroker.it  This test is not yet approved or cleared by the [place]  FDA and has been authorized for detection and/or diagnosis of SARS-CoV-2 by FDA under an Emergency Use Authorization (EUA). This EUA will remain in effect (meaning this test can be used) for the duration of the COVID-19 declaration under Section 564(b)(1) of the Act, 21 U.S.C. section 360bbb-3(b)(1), unless the authorization is terminated or revoked.  Performed at [facility], [address], [place], [place] [address]   Aerobic/Anaerobic Culture w Gram Stain (surgical/deep wound)     Status: None (Preliminary result)   Collection Time: 06/26/24  2:26 PM   Specimen: Abscess  Result Value Ref Range Status   Specimen Description   Final    ABSCESS Performed at [facility], [address] [place],  [place] [address]    Special Requests   Final    NONE Performed at [facility], [address] [place], [place] [address]    Gram Stain   Final    FEW WBC PRESENT, PREDOMINANTLY PMN FEW GRAM POSITIVE COCCI FEW GRAM POSITIVE RODS Performed at [facility], [address] [place], [place] [address]    Culture   Final    CULTURE REINCUBATED FOR BETTER GROWTH NO ANAEROBES ISOLATED; CULTURE IN PROGRESS FOR 5 DAYS    Report Status PENDING  Incomplete  Culture, blood (Routine X 2) w Reflex to ID Panel     Status: None (Preliminary result)   Collection Time: 06/27/24  7:42 PM   Specimen: BLOOD RIGHT ARM  Result Value Ref Range Status   Specimen Description   Final    BLOOD RIGHT ARM Performed at [facility], [address], [place], [place] [address]    Special Requests   Final    BOTTLES DRAWN AEROBIC AND ANAEROBIC Blood Culture adequate volume Performed at [facility], [address] [place], [place] [address]    Culture   Final    NO GROWTH < 12 HOURS Performed at [facility], [address], [place], [place] [address]    Report Status PENDING  Incomplete  Culture, blood (Routine X 2) w  Reflex to ID Panel     Status: None (Preliminary result)   Collection Time: 06/27/24  7:42 PM   Specimen: BLOOD RIGHT HAND  Result Value Ref Range Status   Specimen Description   Final    BLOOD RIGHT HAND Performed at [facility], [address], [place], [place] [address]    Special Requests   Final    BOTTLES DRAWN AEROBIC AND ANAEROBIC Blood Culture results may not be optimal due to an inadequate volume of blood received in culture bottles Performed at [facility], [address], [place], [place] [address]    Culture   Final    NO GROWTH < 12 HOURS Performed at [facility], [address], [place], [place] [address]    Report Status PENDING  Incomplete    Radiology Studies: No results  found.  Scheduled Meds:  levothyroxine   50 mcg Oral Q0600   sodium chloride  flush  5 mL Intracatheter Q8H   Continuous Infusions:  ceFEPime  (MAXIPIME ) IV Stopped (06/28/24 1602)   lactated ringers      metronidazole  500 mg (06/28/24 0839)    LOS: 3 days   Alejandro Marker, DO [facility] Available via Epic secure chat 7am-7pm After these hours, please refer to coverage provider listed on [email or web address] 06/28/2024, 4:51 PM  "

## 2024-06-29 ENCOUNTER — Other Ambulatory Visit (HOSPITAL_COMMUNITY): Payer: Self-pay

## 2024-06-29 DIAGNOSIS — N321 Vesicointestinal fistula: Secondary | ICD-10-CM | POA: Diagnosis not present

## 2024-06-29 DIAGNOSIS — K5720 Diverticulitis of large intestine with perforation and abscess without bleeding: Secondary | ICD-10-CM | POA: Diagnosis not present

## 2024-06-29 DIAGNOSIS — E860 Dehydration: Secondary | ICD-10-CM | POA: Diagnosis not present

## 2024-06-29 DIAGNOSIS — D649 Anemia, unspecified: Secondary | ICD-10-CM | POA: Diagnosis not present

## 2024-06-29 LAB — COMPREHENSIVE METABOLIC PANEL WITH GFR
ALT: 13 U/L (ref 0–44)
AST: 24 U/L (ref 15–41)
Albumin: 2.9 g/dL — ABNORMAL LOW (ref 3.5–5.0)
Alkaline Phosphatase: 71 U/L (ref 38–126)
Anion gap: 9 (ref 5–15)
BUN: 9 mg/dL (ref 8–23)
CO2: 27 mmol/L (ref 22–32)
Calcium: 8.8 mg/dL — ABNORMAL LOW (ref 8.9–10.3)
Chloride: 102 mmol/L (ref 98–111)
Creatinine, Ser: 0.76 mg/dL (ref 0.44–1.00)
GFR, Estimated: >60 mL/min
Glucose, Bld: 92 mg/dL (ref 70–99)
Potassium: 3.7 mmol/L (ref 3.5–5.1)
Sodium: 139 mmol/L (ref 135–145)
Total Bilirubin: 0.2 mg/dL (ref 0.0–1.2)
Total Protein: 6.9 g/dL (ref 6.5–8.1)

## 2024-06-29 LAB — CBC WITH DIFFERENTIAL/PLATELET
Abs Immature Granulocytes: 0.08 10*3/uL — ABNORMAL HIGH (ref 0.00–0.07)
Basophils Absolute: 0 10*3/uL (ref 0.0–0.1)
Basophils Relative: 0 %
Eosinophils Absolute: 0.1 10*3/uL (ref 0.0–0.5)
Eosinophils Relative: 1 %
HCT: 29.9 % — ABNORMAL LOW (ref 36.0–46.0)
Hemoglobin: 9.4 g/dL — ABNORMAL LOW (ref 12.0–15.0)
Immature Granulocytes: 1 %
Lymphocytes Relative: 11 %
Lymphs Abs: 1.4 10*3/uL (ref 0.7–4.0)
MCH: 27.6 pg (ref 26.0–34.0)
MCHC: 31.4 g/dL (ref 30.0–36.0)
MCV: 87.9 fL (ref 80.0–100.0)
Monocytes Absolute: 0.8 10*3/uL (ref 0.1–1.0)
Monocytes Relative: 6 %
Neutro Abs: 10.2 10*3/uL — ABNORMAL HIGH (ref 1.7–7.7)
Neutrophils Relative %: 81 %
Platelets: 457 10*3/uL — ABNORMAL HIGH (ref 150–400)
RBC: 3.4 MIL/uL — ABNORMAL LOW (ref 3.87–5.11)
RDW: 13.9 % (ref 11.5–15.5)
WBC: 12.6 10*3/uL — ABNORMAL HIGH (ref 4.0–10.5)
nRBC: 0 % (ref 0.0–0.2)

## 2024-06-29 LAB — PHOSPHORUS: Phosphorus: 3 mg/dL (ref 2.5–4.6)

## 2024-06-29 LAB — MAGNESIUM: Magnesium: 1.9 mg/dL (ref 1.7–2.4)

## 2024-06-29 MED ORDER — BISACODYL 10 MG RE SUPP: 10.0000 mg | Freq: Every day | RECTAL | Status: DC | PRN

## 2024-06-29 MED ORDER — SODIUM CHLORIDE FLUSH 0.9 % IV SOLN
10.0000 mL | Freq: Every day | INTRAVENOUS | 0 refills | Status: DC
  Filled 2024-06-29: qty 600, 60d supply, fill #0

## 2024-06-29 MED ORDER — SODIUM CHLORIDE 0.9% FLUSH
10.0000 mL | INTRAVENOUS | 0 refills | Status: DC | PRN
  Filled 2024-06-29: qty 300, 30d supply, fill #0

## 2024-06-29 MED ORDER — POLYETHYLENE GLYCOL 3350 17 G PO PACK
17.0000 g | PACK | Freq: Two times a day (BID) | ORAL | Status: DC
  Administered 2024-06-30: 17 g via ORAL
  Filled 2024-06-29 (×4): qty 1

## 2024-06-29 MED ORDER — SENNOSIDES-DOCUSATE SODIUM 8.6-50 MG PO TABS
1.0000 | ORAL_TABLET | Freq: Two times a day (BID) | ORAL | Status: DC
  Administered 2024-06-30 – 2024-07-01 (×3): 1 via ORAL
  Filled 2024-06-29 (×4): qty 1

## 2024-06-29 MED ORDER — SODIUM CHLORIDE FLUSH 0.9 % IV SOLN
10.0000 mL | INTRAVENOUS | 0 refills | Status: DC | PRN
  Filled 2024-06-29: qty 300, fill #0

## 2024-06-29 NOTE — Discharge Instructions (Signed)
 Interventional Radiology Percutaneous Abscess Drain Placement After Care   This sheet gives you information about how to care for yourself after your procedure. Your health care provider may also give you more specific instructions. Your drain was placed by an interventional radiologist with [facility]. If you have questions or concerns, contact [facility] at [phone].   What is a percutaneous drain?   A drain is a small plastic tube (catheter) that goes into the fluid collection in your body through your skin.   How long will I need the drain?   How long the drain needs to stay in is determined by where the drain is, how much comes out of the drain each day and if you are having any other surgical procedures.   Interventional radiology will determine when it is time to remove the drain. It is important to follow up as directed so that the drain can be removed as soon as it is safe to do so.   What can I expect after the procedure?   After the procedure, it is common to have:   A small amount of bruising and discomfort in the area where the drainage tube (catheter) was placed.   Sleepiness and fatigue. This should go away after the medicines you were given have worn off.   Follow these instructions at home:   Insertion site care   Check your insertion site when you change the bandage. Check for:   More redness, swelling, or pain.   More fluid or blood.   Warmth.   Pus or a bad smell.   When caring for your insertion site:   Wash your hands with soap and water for at least 20 seconds before and after you change your bandage (dressing). If soap and water are not available, use hand sanitizer.   You do not need to change your dressing everyday if it is clean and dry. Change your dressing every 3 days or as needed when it is soiled, wet or becoming dislodged. You will need to change your dressing each time you shower.   Leave stitches (sutures), skin  glue, or adhesive strips in place. These skin closures may need to stay in place for 2 weeks or longer. If adhesive strip edges start to loosen and curl up, you may trim the loose edges. Do not remove adhesive strips completely unless your health care provider tells you to do so.   Catheter care   Flush the catheter once per day with 5 mL of 0.9% normal saline unless you are told otherwise by your healthcare provider. This helps to prevent clogs in the catheter.   To disconnect the drain, turn the clear plastic tube to the left. Attach the saline syringe by placing it on the white end of the drain and turning gently to the right. Once attached gently push the plunger to the 5 mL mark. After you are done flushing, disconnect the syringe by turning to the left and reattach your drainage container   If you have a bulb please be sure the bulb is charged after reconnecting it - to do this pinch the bulb between your thumb and first finger and close the stopper located on the top of the bulb.    Check for fluid leaking from around your catheter (instead of fluid draining through your catheter). This may be a sign that the drain is no longer working correctly.   Write down the following information every time you empty your  bag:   The date and time.   The amount of drainage.   Activity   Rest at home for 1-2 days after your procedure.   For the first 48 hours do not lift anything more than 10 lbs (about a gallon of milk). You may perform moderate activities/exercise. Please avoid strenuous activities during this time.   Avoid any activities which may pull on your drain as this can cause your drain to become dislodged.   If you were given a sedative during the procedure, it can affect you for several hours. Do not drive or operate machinery until your health care provider says that it is safe.   General instructions   For mild pain take over-the-counter medications as needed for pain such as  Tylenol or Advil. If you are experiencing severe pain please call our office as this may indicate an issue with your drain.    If you were prescribed an antibiotic medicine, take it as told by your health care provider. Do not stop using the antibiotic even if you start to feel better.   You may shower 24 hours after the drain is placed. To do this cover the insertion site with a water tight material such as saran wrap and seal the edges with tape, you may also purchase waterproof dressings at your local drug store. Shower as usual and then remove the water tight dressing and any gauze/tape underneath it once you have exited the shower and dried off. Allow the area to air dry or pat dry with a clean towel. Once the skin is completely dry place a new gauze dressing. It is important to keep the site dry at all times to prevent infection.   Do not submerge the drain - this means you cannot take baths, swim, use a hot tub, etc. until the drain is removed.    Do not use any products that contain nicotine or tobacco, such as cigarettes, e-cigarettes, and chewing tobacco. If you need help quitting, ask your health care provider.   Keep all follow-up visits as told by your health care provider. This is important.   Contact a health care provider if:   You have less than 10 mL of drainage a day for 2-3 days in a row, or as directed by your health care provider.   You have any of these signs of infection:   More redness, swelling, or pain around your incision area.   More fluid or blood coming from your incision area.   Warmth coming from your incision area.   Pus or a bad smell coming from your incision area.   You have fluid leaking from around your catheter (instead of through your catheter).   You are unable to flush the drain.   You have a fever or chills.   You have pain that does not get better with medicine.   You have not been contacted to schedule a drain follow up appointment  within 10 days of discharge from the hospital.   Please call [facility] at [phone] with any questions or concerns.   Get help right away if:   Your catheter comes out.   You suddenly stop having drainage from your catheter.   You suddenly have blood in the fluid that is draining from your catheter.   You become dizzy or you faint.   You develop a rash.   You have nausea or vomiting.   You have difficulty breathing or you feel short  of breath.   You develop chest pain.   You have problems with your speech or vision.   You have trouble balancing or moving your arms or legs.   Summary   It is common to have a small amount of bruising and discomfort in the area where the drainage tube (catheter) was placed. You may also have minor discomfort with movement while the drain is in place.   Flush the drain once per day with 5 mL of 0.9% normal saline (unless you were told otherwise by your healthcare provider).    Record the amount of drainage from the bag every time you empty it.   Change the dressing every 3 days or earlier if soiled/wet. Keep the skin dry under the dressing.   You may shower with the drain in place. Do not submerge the drain (no baths, swimming, hot tubs, etc.).   Contact [facility] at [phone] you have more redness, swelling, or pain around your incision area or if you have pain that does not get better with medicine.   This information is not intended to replace advice given to you by your health care provider. Make sure you discuss any questions you have with your health care provider.   Document Revised: 08/26/2021 Document Reviewed: 05/18/2019   [employer] Patient Education  2023 [employer]         Interventional Radiology Drain Record   Empty your drain at least once per day. You may empty it as often as needed. Use this form to write down the amount of fluid that has collected in the drainage container. Bring  this form with you to your follow-up visits. Please call [facility] at [phone] with any questions or concerns prior to your appointment.   Drain #1 location: ___________________   Date __________ Time __________ Amount __________   Date __________ Time __________ Amount __________   Date __________ Time __________ Amount __________   Date __________ Time __________ Amount __________   Date __________ Time __________ Amount __________   Date __________ Time __________ Amount __________   Date __________ Time __________ Amount __________   Date __________ Time __________ Amount __________   Date __________ Time __________ Amount __________   Date __________ Time __________ Amount __________   Date __________ Time __________ Amount __________   Date __________ Time __________ Amount __________   Date __________ Time __________ Amount __________   Date __________ Time __________ Amount __________

## 2024-06-29 NOTE — Progress Notes (Signed)
 " PROGRESS NOTE    Gwendolyn Sweeney  FMW:[id number] DOB: July 07, 1960 DOA: 06/25/2024 PCP: [name], MD   Brief Narrative:  The patient is a 64 year old obese Caucasian female with past medical history significant for but not limited to cystocele/rectocele, frequent UTIs, hypothyroidism and other comorbidities who presents with abdominal discomfort and pain along with fevers.  Pain has been going on for 3 weeks and she was instructed by primary care But presents with worsening.  The pain and fever was initially attributed to influenza B and UTI which she was given Macrobid  for.    She continued to have left lower quadrant abdominal discomfort despite antibiotics and went to [facility] and had a CT scan which showed diverticulitis with microperforation and a diverticular abscess along with colovesicular fistula. Transferred to [facility] for Inpt Admission.  General Surgery was consulted and she was given IV antibiotics IV fluids and admitted.  IR was consulted she is s/p LLQ Drain placement. Continued to spike intermittent temperatures so she was given IV fluids and had a blood culture repeated.  Diet is advanced to soft now.  Drain cultures still pending.  The surgical team feels that she can be safely discharged to SNF on oral Augmentin  however given her recent E. coli with resistance in her urine we will await her cultures and sensitivities from her drain to ensure we appropriately send her out on the right antibiotics given her abscess.  Assessment and Plan:  Diverticulitis of Colon with Perforation: Continue IV Cefepime  and Flagyl ; appreciate General Surgery consult and they recommended IR evaluation for drain placement given that the diverticular abscess is 6.9 x 5.3 x 6.4 cm. She is now s/p 80F drainage catheter into the LLQ Abscess (placed 06/26/24) and specimens sent for Gram Stain, Aerobic and Anaerobic Cx -Current Gram Stain + Cx:  Gram Stain FEW WBC PRESENT, PREDOMINANTLY PMN FEW GRAM POSITIVE  COCCI FEW GRAM POSITIVE RODS  Culture FEW STREPTOCOCCUS INTERMEDIUS RARE GRAM NEGATIVE RODS CULTURE REINCUBATED FOR BETTER GROWTH Performed at [facility], [address], [place], [place] [address]  -WBC Improving and went from 15.9 -> 15.4 -> 13.9 -> 12.2 -> 12.6 -Has been Afebrile in the last 24 hours  -Continue IV fluid hydration as below but given her Temperature spikes yesterday Afternoon she was given two 1 Liter boluses. Remains on Soft Diet today. -Continue with supportive care continue with acetaminophen  650 mg p.o./RC every 6 as needed for mild pain, IV fentanyl  12.5 to 50 mcg Q2 as needed for severe pain.  Continue with antiemetics with ondansetron  4 mg p.o./IV every 6 as needed. Further care per General Surgery and IR -General Surgery feels that she can be discharged from their standpoint and patient has had diet progression from full liquid to soft diet.  The surgical team is recommending IR follow-up and IR follow-up is being placed on the chart.  Patient to follow-up with Dr. Evangelina in 3 to 4 weeks and ultimately with one of the colorectal surgeons.  Surgery team feels that she needs at least 2 weeks of antibiotic therapy but after discussion with ID will need to ensure she adequately gets appropriate antibiotic treatment given her cultures. -Will await her final cultures and sensitivities and clearance by the ID team to safely discharge her on the appropriate antibiotic regimen  Recent suspected E. Coli UTI: Urinalysis showed cloudy appearance with moderate hemoglobin, trace ketones, large leukocytes, negative nitrites, 100 protein, urine specific gravity of 1.029, no bacteria seen, greater than 50  WBCs and urine culture showing E. coli 50,000 colony-forming units with resistance to ampicillin , ampicillin  sulbactam and ciprofloxacin.  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  Defer to general surgery  for further evaluation and they feel that hopefully the direct diverticulitis will subside with antibiotics, bowel rest and IR drainage and they are planning for outpatient follow-up with the colorectal surgeon to discuss 1 stage resection and repair -Continue IV Cefepime  and Flagyl  for now and transition to oral Abx when Cx Results    Hypothyroidism: TSH is 2.850. C/w po Levothyroxine  50 mcg po Daily    Normocytic Anemia: Likely dilutional drop from IVF. Hgb/Hct Trend:  Recent Labs  Lab 06/25/24 1650 06/26/24 0101 06/27/24 0505 06/28/24 0458 06/29/24 0830  HGB 11.9* 9.9* 9.9* 9.2* 9.4*  HCT 37.0 30.5* 30.6* 29.9* 29.9*  MCV 86.4 85.2 87.7 89.3 87.9  -Checked Anemia Panel and showed an iron level 16, UIBC 160, TIBC 176, saturation of show 9%, ferritin level 94, folate level greater than 20.0 and Vitamin B12 was 509. CTM for S/Sx of Bleeding; No overt bleeding noted. Repeat CBC in the AM  Renal Insufficiency: Baseline Cr ~1.0. BUN/Cr went from 21/1.15 -> 19/1.08 -> 14/1.08 -> 9/0.87 -> 9/0.76. IVF now stopped. Avoid Nephrotoxic Medications, Contrast Dyes, Hypotension and Dehydration to Ensure Adequate Renal Perfusion and will need to Renally Adjust Meds. CTM & Trend Renal Function carefully & repeat CMP in the AM    Dehydration: Noted and Improved. Mild Renal Insufficiency that is stable and slightly improved; will Rehydrate as above; Follow labs  Thrombocytosis: Likely reactive in the setting of above. Plt Count went from 546 -> 472 -> 458 -> 436 -> 457. CTM & Trend & repeat CBC in the AM   Hypoalbuminemia: Patient's Albumin Lvl went from 3.8 -> 3.0 -> 2.9 -> 2.7 -> 2.9. CTM & Trend & repeat CMP in the AM  Class I Obesity: Complicates overall prognosis and care. Estimated body mass index is 33.33 kg/m as calculated from the following:   Height as of this encounter: 4' 11 (1.499 m).   Weight as of this encounter: 74.8 kg. Weight Loss and Dietary Counseling given   DVT prophylaxis: SCDs  Start: 06/26/24 0000    Code Status: Full Code Family Communication: D/w Husband @ bedside  Disposition Plan:  Level of care: Med-Surg Status is: Inpatient Remains inpatient appropriate because: Awaiting the drain culture speciation and sensitivities given that we need to ensure that she gets put on appropriate antibiotics for discharge   Consultants:  General Surgery Interventional Radiology Discussed with ID Dr. Dennise  Procedures:  As delineated as above  Antimicrobials:  Anti-infectives (From admission, onward)    Start     Dose/Rate Route Frequency Ordered Stop   06/27/24 0000  amoxicillin -clavulanate (AUGMENTIN ) 875-125 MG tablet        1 tablet Oral 2 times daily 06/27/24 0850 07/11/24 2359   06/26/24 1000  metroNIDAZOLE  (FLAGYL ) IVPB 500 mg       Placed in And Linked Group   500 mg 100 mL/hr over 60 Minutes Intravenous Every 12 hours 06/25/24 2358     06/26/24 0800  ceFEPIme  (MAXIPIME ) 2 g in sodium chloride  0.9 % 100 mL IVPB        2 g 200 mL/hr over 30 Minutes Intravenous Every 12 hours 06/26/24 0002     06/26/24 0045  ceFEPIme  (MAXIPIME ) 2 g in sodium chloride  0.9 % 100 mL  IVPB  Status:  Discontinued       Placed in And Linked Group   2 g 200 mL/hr over 30 Minutes Intravenous  Once 06/25/24 2358 06/26/24 0002   06/25/24 2015  ceFEPIme  (MAXIPIME ) 2 g in sodium chloride  0.9 % 100 mL IVPB       Placed in And Linked Group   2 g 200 mL/hr over 30 Minutes Intravenous  Once 06/25/24 2009 06/25/24 2101   06/25/24 2015  metroNIDAZOLE  (FLAGYL ) IVPB 500 mg       Placed in And Linked Group   500 mg 100 mL/hr over 60 Minutes Intravenous  Once 06/25/24 2009 06/25/24 2241       Subjective: Seen and examined at bedside and was feeling okay and feels some mild abdominal discomfort and pain whenever she gets up.  No nausea or vomiting.  Has not been febrile in the last 24 hours.  Denies chest pain or shortness of breath.  No other concerns or complaints at this  time.  Objective: Vitals:   06/28/24 2014 06/29/24 0230 06/29/24 0611 06/29/24 0840  BP: (!) 143/72 (!) 146/69 (!) 156/73 (!) 144/70  Pulse: 83 81 81 79  Resp: 16 16 16 14   Temp: 98.6 F (37 C) 98.8 F (37.1 C) 98.6 F (37 C) 98.4 F (36.9 C)  TempSrc: Oral Oral Oral Oral  SpO2: 97% 96% 96% 98%  Weight:      Height:        Intake/Output Summary (Last 24 hours) at 06/29/2024 1555 Last data filed at 06/29/2024 1358 Gross per 24 hour  Intake 1130 ml  Output 20 ml  Net 1110 ml   Filed Weights   06/25/24 1646  Weight: 74.8 kg   Examination: Physical Exam:  Constitutional: WN/WD, obese Caucasian female in NAD appears calm Respiratory: Diminished to auscultation bilaterally, no wheezing, rales, rhonchi or crackles. Normal respiratory effort and patient is not tachypenic. No accessory muscle use. Unlabored breathing  Cardiovascular: RRR, no murmurs / rubs / gallops. S1 and S2 auscultated. No extremity edema. Abdomen: Soft, Mildly-tender, Distended 2/2 body habitus. Drain in place. Bowel sounds positive.  GU: Deferred. Musculoskeletal: No clubbing / cyanosis of digits/nails. No joint deformity upper and lower extremities.  Skin: No rashes, lesions, ulcers on a limited skin evaluation. No induration; Warm and dry.  Neurologic: CN 2-12 grossly intact with no focal deficits. Romberg sign and cerebellar reflexes not assessed.  Psychiatric: Normal judgment and insight. Alert and oriented x 3. Normal mood and appropriate affect.   Data Reviewed: I have personally reviewed following labs and imaging studies  CBC: Recent Labs  Lab 06/25/24 1650 06/26/24 0101 06/27/24 0505 06/28/24 0458 06/29/24 0830  WBC 15.9* 15.4* 13.9* 12.2* 12.6*  NEUTROABS 13.4*  --  11.1* 9.8* 10.2*  HGB 11.9* 9.9* 9.9* 9.2* 9.4*  HCT 37.0 30.5* 30.6* 29.9* 29.9*  MCV 86.4 85.2 87.7 89.3 87.9  PLT 546* 472* 458* 436* 457*   Basic Metabolic Panel: Recent Labs  Lab 06/25/24 1650 06/26/24 0101  06/27/24 0505 06/28/24 0458 06/29/24 0830  NA 137 136 137 139 139  K 4.6 4.4 4.4 3.7 3.7  CL 95* 98 101 104 102  CO2 29 27 25 26 27   GLUCOSE 103* 87 78 87 92  BUN 21 19 14 9 9   CREATININE 1.15* 1.08* 1.08* 0.87 0.76  CALCIUM 10.1 9.0 9.2 8.7* 8.8*  MG  --  2.1 2.2 2.1 1.9  PHOS  --  4.0 4.3 3.0 3.0  GFR: Estimated Creatinine Clearance: 61.8 mL/min (by C-G formula based on SCr of 0.76 mg/dL). Liver Function Tests: Recent Labs  Lab 06/25/24 1650 06/26/24 0101 06/27/24 0505 06/28/24 0458 06/29/24 0830  AST 25 21 19 19 24   ALT 22 17 13 11 13   ALKPHOS 102 101 99 87 71  BILITOT 0.6 0.5 0.4 0.3 0.2  PROT 9.0* 7.4 7.1 6.5 6.9  ALBUMIN 3.8 3.0* 2.9* 2.7* 2.9*   No results for input(s): LIPASE, AMYLASE in the last 168 hours. No results for input(s): AMMONIA in the last 168 hours. Coagulation Profile: Recent Labs  Lab 06/25/24 2139  INR 1.2   Cardiac Enzymes: No results for input(s): CKTOTAL, CKMB, CKMBINDEX, TROPONINI in the last 168 hours. BNP (last 3 results) No results for input(s): PROBNP in the last 8760 hours. HbA1C: No results for input(s): HGBA1C in the last 72 hours. CBG: No results for input(s): GLUCAP in the last 168 hours. Lipid Profile: No results for input(s): CHOL, HDL, LDLCALC, TRIG, CHOLHDL, LDLDIRECT in the last 72 hours. Thyroid  Function Tests: No results for input(s): TSH, T4TOTAL, FREET4, T3FREE, THYROIDAB in the last 72 hours. Anemia Panel: No results for input(s): VITAMINB12, FOLATE, FERRITIN, TIBC, IRON, RETICCTPCT in the last 72 hours. Sepsis Labs: Recent Labs  Lab 06/25/24 1652  LATICACIDVEN 1.2   Recent Results (from the past 240 hours)  Urine Culture     Status: Abnormal   Collection Time: 06/25/24  6:34 PM   Specimen: Urine, Clean Catch  Result Value Ref Range Status   Specimen Description   Final    URINE, CLEAN CATCH Performed at [facility], [address] [place], [place] [address]    Special Requests   Final    NONE Performed at [facility], [address], [place], [place] [address]    Culture 50,000 COLONIES/mL ESCHERICHIA COLI (A)  Final   Report Status 06/27/2024 FINAL  Final   Organism ID, Bacteria ESCHERICHIA COLI (A)  Final      Susceptibility   Escherichia coli - MIC*    AMPICILLIN  >=32 RESISTANT Resistant     CEFAZOLIN (URINE) Value in next row Sensitive      8 SENSITIVEThis is a modified FDA-approved test that has been validated and its performance characteristics determined by the reporting laboratory.  This laboratory is certified under the Clinical Laboratory Improvement Amendments CLIA as qualified to perform high complexity clinical laboratory testing.    CEFEPIME  Value in next row Sensitive      8 SENSITIVEThis is a modified FDA-approved test that has been validated and its performance characteristics determined by the reporting laboratory.  This laboratory is certified under the Clinical Laboratory Improvement Amendments CLIA as qualified to perform high complexity clinical laboratory testing.    ERTAPENEM Value in next row Sensitive      8 SENSITIVEThis is a modified FDA-approved test that has been validated and its performance characteristics determined by the reporting laboratory.  This laboratory is certified under the Clinical Laboratory Improvement Amendments CLIA as qualified to perform high complexity clinical laboratory testing.    CEFTRIAXONE  Value in next row Sensitive      8 SENSITIVEThis is a modified FDA-approved test that has been validated and its performance characteristics determined by the reporting laboratory.  This laboratory is certified under the Clinical Laboratory Improvement Amendments CLIA as qualified to perform high complexity clinical laboratory testing.    CIPROFLOXACIN Value in next row Resistant      8 SENSITIVEThis is a modified  FDA-approved test that has been validated  and its performance characteristics determined by the reporting laboratory.  This laboratory is certified under the Clinical Laboratory Improvement Amendments CLIA as qualified to perform high complexity clinical laboratory testing.    GENTAMICIN Value in next row Sensitive      8 SENSITIVEThis is a modified FDA-approved test that has been validated and its performance characteristics determined by the reporting laboratory.  This laboratory is certified under the Clinical Laboratory Improvement Amendments CLIA as qualified to perform high complexity clinical laboratory testing.    NITROFURANTOIN  Value in next row Sensitive      8 SENSITIVEThis is a modified FDA-approved test that has been validated and its performance characteristics determined by the reporting laboratory.  This laboratory is certified under the Clinical Laboratory Improvement Amendments CLIA as qualified to perform high complexity clinical laboratory testing.    TRIMETH /SULFA  Value in next row Sensitive      8 SENSITIVEThis is a modified FDA-approved test that has been validated and its performance characteristics determined by the reporting laboratory.  This laboratory is certified under the Clinical Laboratory Improvement Amendments CLIA as qualified to perform high complexity clinical laboratory testing.    AMPICILLIN /SULBACTAM Value in next row Resistant      8 SENSITIVEThis is a modified FDA-approved test that has been validated and its performance characteristics determined by the reporting laboratory.  This laboratory is certified under the Clinical Laboratory Improvement Amendments CLIA as qualified to perform high complexity clinical laboratory testing.    PIP/TAZO Value in next row Sensitive      <=4 SENSITIVEThis is a modified FDA-approved test that has been validated and its performance characteristics determined by the reporting laboratory.  This laboratory is certified under the Clinical Laboratory Improvement Amendments  CLIA as qualified to perform high complexity clinical laboratory testing.    MEROPENEM Value in next row Sensitive      <=4 SENSITIVEThis is a modified FDA-approved test that has been validated and its performance characteristics determined by the reporting laboratory.  This laboratory is certified under the Clinical Laboratory Improvement Amendments CLIA as qualified to perform high complexity clinical laboratory testing.    * 50,000 COLONIES/mL ESCHERICHIA COLI  Blood culture (routine x 2)     Status: None (Preliminary result)   Collection Time: 06/25/24  6:35 PM   Specimen: BLOOD  Result Value Ref Range Status   Specimen Description   Final    BLOOD RIGHT ANTECUBITAL Performed at [facility], [address] [place], [place] [address]    Special Requests   Final    Blood Culture adequate volume BOTTLES DRAWN AEROBIC AND ANAEROBIC Performed at [facility], [address], [place], [place] [address]    Culture   Final    NO GROWTH 4 DAYS Performed at [facility], [address], [place], [place] [address]    Report Status PENDING  Incomplete  Blood culture (routine x 2)     Status: None (Preliminary result)   Collection Time: 06/25/24  6:40 PM   Specimen: BLOOD RIGHT HAND  Result Value Ref Range Status   Specimen Description   Final    BLOOD RIGHT HAND Performed at [facility], [address] [place], [place] [address]    Special Requests   Final    Blood Culture adequate volume BOTTLES DRAWN AEROBIC AND ANAEROBIC Performed at [facility], [address] [place], [place] [address]    Culture   Final  NO GROWTH 4 DAYS Performed at [facility], [address], [place], [place] [address]    Report Status PENDING  Incomplete  Resp panel by RT-PCR (RSV, Flu A&B, Covid) Anterior Nasal Swab     Status: None   Collection Time: 06/25/24  7:01 PM   Specimen: Anterior Nasal Swab  Result Value Ref  Range Status   SARS Coronavirus 2 by RT PCR NEGATIVE NEGATIVE Final    Comment: (NOTE) SARS-CoV-2 target nucleic acids are NOT DETECTED.  The SARS-CoV-2 RNA is generally detectable in upper respiratory specimens during the acute phase of infection. The lowest concentration of SARS-CoV-2 viral copies this assay can detect is 138 copies/mL. A negative result does not preclude SARS-Cov-2 infection and should not be used as the sole basis for treatment or other patient management decisions. A negative result may occur with  improper specimen collection/handling, submission of specimen other than nasopharyngeal swab, presence of viral mutation(s) within the areas targeted by this assay, and inadequate number of viral copies(<138 copies/mL). A negative result must be combined with clinical observations, patient history, and epidemiological information. The expected result is Negative.  Fact Sheet for Patients:  bloggercourse.com  Fact Sheet for Healthcare Providers:  seriousbroker.it  This test is no t yet approved or cleared by the [place]  FDA and  has been authorized for detection and/or diagnosis of SARS-CoV-2 by FDA under an Emergency Use Authorization (EUA). This EUA will remain  in effect (meaning this test can be used) for the duration of the COVID-19 declaration under Section 564(b)(1) of the Act, 21 U.S.C.section 360bbb-3(b)(1), unless the authorization is terminated  or revoked sooner.       Influenza A by PCR NEGATIVE NEGATIVE Final   Influenza B by PCR NEGATIVE NEGATIVE Final    Comment: (NOTE) The Xpert Xpress SARS-CoV-2/FLU/RSV plus assay is intended as an aid in the diagnosis of influenza from Nasopharyngeal swab specimens and should not be used as a sole basis for treatment. Nasal washings and aspirates are unacceptable for Xpert Xpress SARS-CoV-2/FLU/RSV testing.  Fact Sheet for  Patients: bloggercourse.com  Fact Sheet for Healthcare Providers: seriousbroker.it  This test is not yet approved or cleared by the [place]  FDA and has been authorized for detection and/or diagnosis of SARS-CoV-2 by FDA under an Emergency Use Authorization (EUA). This EUA will remain in effect (meaning this test can be used) for the duration of the COVID-19 declaration under Section 564(b)(1) of the Act, 21 U.S.C. section 360bbb-3(b)(1), unless the authorization is terminated or revoked.     Resp Syncytial Virus by PCR NEGATIVE NEGATIVE Final    Comment: (NOTE) Fact Sheet for Patients: bloggercourse.com  Fact Sheet for Healthcare Providers: seriousbroker.it  This test is not yet approved or cleared by the [place]  FDA and has been authorized for detection and/or diagnosis of SARS-CoV-2 by FDA under an Emergency Use Authorization (EUA). This EUA will remain in effect (meaning this test can be used) for the duration of the COVID-19 declaration under Section 564(b)(1) of the Act, 21 U.S.C. section 360bbb-3(b)(1), unless the authorization is terminated or revoked.  Performed at [facility], [address], [place], [place] [address]   Aerobic/Anaerobic Culture w Gram Stain (surgical/deep wound)     Status: None (Preliminary result)   Collection Time: 06/26/24  2:26 PM   Specimen: Abscess  Result Value Ref Range Status   Specimen Description   Final    ABSCESS Performed at [facility], [address]  [place], [place] [address]    Special Requests   Final    NONE Performed at [facility], [address] [place], [place] [address]    Gram Stain   Final    FEW WBC PRESENT, PREDOMINANTLY PMN FEW GRAM POSITIVE COCCI FEW GRAM POSITIVE RODS    Culture   Final    FEW STREPTOCOCCUS INTERMEDIUS RARE GRAM  NEGATIVE RODS CULTURE REINCUBATED FOR BETTER GROWTH Performed at [facility], [address] [place], [place] [address]    Report Status PENDING  Incomplete  Culture, blood (Routine X 2) w Reflex to ID Panel     Status: None (Preliminary result)   Collection Time: 06/27/24  7:42 PM   Specimen: BLOOD RIGHT ARM  Result Value Ref Range Status   Specimen Description   Final    BLOOD RIGHT ARM Performed at [facility], [address], [place], [place] [address]    Special Requests   Final    BOTTLES DRAWN AEROBIC AND ANAEROBIC Blood Culture adequate volume Performed at [facility], [address], [place], [place] [address]    Culture   Final    NO GROWTH 2 DAYS Performed at [facility], [address] [place], [place] [address]    Report Status PENDING  Incomplete  Culture, blood (Routine X 2) w Reflex to ID Panel     Status: None (Preliminary result)   Collection Time: 06/27/24  7:42 PM   Specimen: BLOOD RIGHT HAND  Result Value Ref Range Status   Specimen Description   Final    BLOOD RIGHT HAND Performed at [facility], [address], [place], [place] [address]    Special Requests   Final    BOTTLES DRAWN AEROBIC AND ANAEROBIC Blood Culture results may not be optimal due to an inadequate volume of blood received in culture bottles Performed at [facility], [address] [place], [place] [address]    Culture   Final    NO GROWTH 2 DAYS Performed at [facility], [address], [place], [place] [address]    Report Status PENDING  Incomplete    Radiology Studies: No results found.  Scheduled Meds:  levothyroxine   50 mcg Oral Q0600   polyethylene glycol  17 g Oral BID   senna-docusate  1 tablet Oral BID   sodium chloride  flush  5 mL Intracatheter Q8H   Continuous Infusions:  ceFEPime  (MAXIPIME ) IV Stopped (06/29/24 9161)   lactated ringers      metronidazole  500 mg (06/29/24 0837)    LOS: 4  days   Alejandro Marker, DO [facility] Available via Epic secure chat 7am-7pm After these hours, please refer to coverage provider listed on [email or web address] 06/29/2024, 3:55 PM  "

## 2024-06-29 NOTE — Progress Notes (Signed)
 Discharge meds in a secure bag delivered to patient by this RN

## 2024-06-29 NOTE — Progress Notes (Signed)
 Patient ID: EVELEN VAZGUEZ, female   DOB: 26-Jan-1961, 64 y.o.   MRN: [id number]   Acute Care Surgery Service Progress Note:    Chief Complaint/Subjective: Feels good No n/v Appetite still a little off Ambulated, drain pulls at times but otherwise no significant abd pain No fever x 24hrs Husband at [facility]  Objective: Vital signs in last 24 hours: Temp:  [98.4 F (36.9 C)-98.9 F (37.2 C)] 98.6 F (37 C) (01/24 9388) Pulse Rate:  [80-83] 81 (01/24 0611) Resp:  [16] 16 (01/24 0611) BP: (141-157)/(69-79) 156/73 (01/24 0611) SpO2:  [96 %-97 %] 96 % (01/24 0611) Last BM Date : 06/29/24  Intake/Output from previous day: 01/23 0701 - 01/24 0700 In: 1603 [P.O.:660; I.V.:523; IV Piggyback:400] Out: 20 [Drains:20] Intake/Output this shift: No intake/output data recorded.  Lungs:  nonlabored  Cardiovascular: reg  Abd: soft, obese, nd, min to NT; drain - thin purulent  Extremities: no edema, +SCDs  Neuro: alert, nonfocal  Lab Results: CBC  Recent Labs    06/28/24 0458 06/29/24 0830  WBC 12.2* 12.6*  HGB 9.2* 9.4*  HCT 29.9* 29.9*  PLT 436* 457*   BMET Recent Labs    06/28/24 0458 06/29/24 0830  NA 139 139  K 3.7 3.7  CL 104 102  CO2 26 27  GLUCOSE 87 92  BUN 9 9  CREATININE 0.87 0.76  CALCIUM 8.7* 8.8*   LFT    Latest Ref Rng & Units 06/29/2024    8:30 AM 06/28/2024    4:58 AM 06/27/2024    5:05 AM  Hepatic Function  Total Protein 6.5 - 8.1 g/dL 6.9  6.5  7.1   Albumin 3.5 - 5.0 g/dL 2.9  2.7  2.9   AST 15 - 41 U/L 24  19  19    ALT 0 - 44 U/L 13  11  13    Alk Phosphatase 38 - 126 U/L 71  87  99   Total Bilirubin 0.0 - 1.2 mg/dL 0.2  0.3  0.4    PT/INR No results for input(s): LABPROT, INR in the last 72 hours. ABG No results for input(s): PHART, HCO3 in the last 72 hours.  Invalid input(s): PCO2, PO2  Studies/Results:  Anti-infectives: Anti-infectives (From admission, onward)    Start     Dose/Rate Route Frequency Ordered Stop    06/27/24 0000  amoxicillin -clavulanate (AUGMENTIN ) 875-125 MG tablet        1 tablet Oral 2 times daily 06/27/24 0850 07/11/24 2359   06/26/24 1000  metroNIDAZOLE  (FLAGYL ) IVPB 500 mg       Placed in And Linked Group   500 mg 100 mL/hr over 60 Minutes Intravenous Every 12 hours 06/25/24 2358     06/26/24 0800  ceFEPIme  (MAXIPIME ) 2 g in sodium chloride  0.9 % 100 mL IVPB        2 g 200 mL/hr over 30 Minutes Intravenous Every 12 hours 06/26/24 0002     06/26/24 0045  ceFEPIme  (MAXIPIME ) 2 g in sodium chloride  0.9 % 100 mL IVPB  Status:  Discontinued       Placed in And Linked Group   2 g 200 mL/hr over 30 Minutes Intravenous  Once 06/25/24 2358 06/26/24 0002   06/25/24 2015  ceFEPIme  (MAXIPIME ) 2 g in sodium chloride  0.9 % 100 mL IVPB       Placed in And Linked Group   2 g 200 mL/hr over 30 Minutes Intravenous  Once 06/25/24 2009 06/25/24 2101   06/25/24 2015  metroNIDAZOLE  (FLAGYL ) IVPB 500 mg       Placed in And Linked Group   500 mg 100 mL/hr over 60 Minutes Intravenous  Once 06/25/24 2009 06/25/24 2241       Medications: Scheduled Meds:  levothyroxine   50 mcg Oral Q0600   polyethylene glycol  17 g Oral BID   senna-docusate  1 tablet Oral BID   sodium chloride  flush  5 mL Intracatheter Q8H   Continuous Infusions:  ceFEPime  (MAXIPIME ) IV Stopped (06/29/24 9161)   lactated ringers      metronidazole  500 mg (06/29/24 0837)   PRN Meds:.acetaminophen  **OR** acetaminophen , bisacodyl , fentaNYL  (SUBLIMAZE ) injection, ondansetron  **OR** ondansetron  (ZOFRAN ) IV, oxyCODONE   Assessment/Plan: 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   Hypothyroidism 06/26/2024   Anemia 06/26/2024   Dehydration 06/26/2024   Diverticulitis of colon with perforation 06/25/2024   Cystocele with rectocele 03/16/2023   Encounter for well woman exam with routine gynecological exam 03/16/2023   Urinary frequency 03/16/2023   Encounter for screening  fecal occult blood testing 08/10/2020   Visit for pelvic exam 08/10/2020   S/P hysterectomy 08/10/2020   Dysuria 08/10/2020   Hematuria 08/10/2020   Fatty liver 10/11/2016   Rectocele 08/08/2016   Pelvic floor relaxation 03/23/2015   Fatigue 04/08/2013    64 year old woman presents with 3 weeks of abdominal pain, intermittent fevers and UTI. - Diverticular abscess 6.9 x 5.3 x 6.4 cm: IV antibiotics, IR drain placed 1/21. WBC slightly better today, febrile to 102.4 the other night; no fever x 24hrs.  Continue soft diet, mobilize, miralax  - Colovesical fistula: Hopefully diverticulitis will subside with above treatment and plan will be for outpatient follow-up with colorectal surgeon to discuss 1 stage resection and repair.   No fever x 24hrs Wbc stable Tolerating diet  Ok for dc from our standpoint Discussed diet progression - full liquid to soft diet Working on coordinating IR follow-up; they will put IR f/u on chart F/u dr Signe 3-4 weeks; ultimately with one of our CR surgeons Needs total of 2 weeks of abx therapy   Per [facility]: Hypothyroidism obesity Normocytic anemia Renal insufficiency/dehydration Thrombocytosis hypoalbuminemia Disposition:  Data reviewed: vitals x 36hrs, labs x 48hrs, progress notes x 48 hrs,   High MDM  LOS: 4 days    Camellia HERO. Tanda, MD, FACS General, Bariatric, & Minimally Invasive Surgery [phone] [facility] Surgery, A [facility]

## 2024-06-30 DIAGNOSIS — K5720 Diverticulitis of large intestine with perforation and abscess without bleeding: Secondary | ICD-10-CM | POA: Diagnosis not present

## 2024-06-30 DIAGNOSIS — D649 Anemia, unspecified: Secondary | ICD-10-CM | POA: Diagnosis not present

## 2024-06-30 DIAGNOSIS — E039 Hypothyroidism, unspecified: Secondary | ICD-10-CM | POA: Diagnosis not present

## 2024-06-30 DIAGNOSIS — N321 Vesicointestinal fistula: Secondary | ICD-10-CM | POA: Diagnosis not present

## 2024-06-30 DIAGNOSIS — E860 Dehydration: Secondary | ICD-10-CM | POA: Diagnosis not present

## 2024-06-30 LAB — COMPREHENSIVE METABOLIC PANEL WITH GFR
ALT: 20 U/L (ref 0–44)
AST: 40 U/L (ref 15–41)
Albumin: 2.8 g/dL — ABNORMAL LOW (ref 3.5–5.0)
Alkaline Phosphatase: 71 U/L (ref 38–126)
Anion gap: 8 (ref 5–15)
BUN: 9 mg/dL (ref 8–23)
CO2: 29 mmol/L (ref 22–32)
Calcium: 8.9 mg/dL (ref 8.9–10.3)
Chloride: 102 mmol/L (ref 98–111)
Creatinine, Ser: 0.84 mg/dL (ref 0.44–1.00)
GFR, Estimated: >60 mL/min
Glucose, Bld: 102 mg/dL — ABNORMAL HIGH (ref 70–99)
Potassium: 3.7 mmol/L (ref 3.5–5.1)
Sodium: 140 mmol/L (ref 135–145)
Total Bilirubin: 0.3 mg/dL (ref 0.0–1.2)
Total Protein: 7 g/dL (ref 6.5–8.1)

## 2024-06-30 LAB — CBC WITH DIFFERENTIAL/PLATELET
Abs Immature Granulocytes: 0.08 10*3/uL — ABNORMAL HIGH (ref 0.00–0.07)
Basophils Absolute: 0 10*3/uL (ref 0.0–0.1)
Basophils Relative: 0 %
Eosinophils Absolute: 0.2 10*3/uL (ref 0.0–0.5)
Eosinophils Relative: 1 %
HCT: 30.2 % — ABNORMAL LOW (ref 36.0–46.0)
Hemoglobin: 9.5 g/dL — ABNORMAL LOW (ref 12.0–15.0)
Immature Granulocytes: 1 %
Lymphocytes Relative: 13 %
Lymphs Abs: 1.6 10*3/uL (ref 0.7–4.0)
MCH: 27.8 pg (ref 26.0–34.0)
MCHC: 31.5 g/dL (ref 30.0–36.0)
MCV: 88.3 fL (ref 80.0–100.0)
Monocytes Absolute: 0.9 10*3/uL (ref 0.1–1.0)
Monocytes Relative: 7 %
Neutro Abs: 9.4 10*3/uL — ABNORMAL HIGH (ref 1.7–7.7)
Neutrophils Relative %: 78 %
Platelets: 504 10*3/uL — ABNORMAL HIGH (ref 150–400)
RBC: 3.42 MIL/uL — ABNORMAL LOW (ref 3.87–5.11)
RDW: 14 % (ref 11.5–15.5)
WBC: 12.1 10*3/uL — ABNORMAL HIGH (ref 4.0–10.5)
nRBC: 0 % (ref 0.0–0.2)

## 2024-06-30 LAB — CULTURE, BLOOD (ROUTINE X 2)
Culture: NO GROWTH
Culture: NO GROWTH
Special Requests: ADEQUATE
Special Requests: ADEQUATE

## 2024-06-30 LAB — PHOSPHORUS: Phosphorus: 3.6 mg/dL (ref 2.5–4.6)

## 2024-06-30 LAB — MAGNESIUM: Magnesium: 2.2 mg/dL (ref 1.7–2.4)

## 2024-06-30 MED ORDER — MELATONIN 5 MG PO TABS
5.0000 mg | ORAL_TABLET | Freq: Once | ORAL | Status: AC
  Administered 2024-06-30: 5 mg via ORAL
  Filled 2024-06-30: qty 1

## 2024-06-30 NOTE — Progress Notes (Signed)
 Patient ID: Gwendolyn Sweeney, female   DOB: 06-07-1960, 64 y.o.   MRN: [id number]   Acute Care Surgery Service Progress Note:    Chief Complaint/Subjective: Feels well, has some discomfort at the drain site when she moves around but otherwise no abdominal pain.  Having bowel movements and tolerating a diet.  Has not been febrile for over 24 hours.  Objective: Vital signs in last 24 hours: Temp:  [98 F (36.7 C)-99.1 F (37.3 C)] 98.5 F (36.9 C) (01/25 0502) Pulse Rate:  [80-83] 80 (01/25 0502) Resp:  [16] 16 (01/25 0502) BP: (133-146)/(76-78) 140/76 (01/25 0502) SpO2:  [96 %-97 %] 96 % (01/25 0502) Last BM Date : 06/29/24  Intake/Output from previous day: 01/24 0701 - 01/25 0700 In: 1299.7 [P.O.:780; I.V.:5; IV Piggyback:499.7] Out: 20 [Drains:20] Intake/Output this shift: No intake/output data recorded.  Lungs:  nonlabored  Cardiovascular: reg  Abd: soft, obese, nd, min to NT; drain -essentially serosanguineous today  Extremities: no edema, +SCDs  Neuro: alert, nonfocal  Lab Results: CBC  Recent Labs    06/29/24 0830 06/30/24 0447  WBC 12.6* 12.1*  HGB 9.4* 9.5*  HCT 29.9* 30.2*  PLT 457* 504*   BMET Recent Labs    06/29/24 0830 06/30/24 0447  NA 139 140  K 3.7 3.7  CL 102 102  CO2 27 29  GLUCOSE 92 102*  BUN 9 9  CREATININE 0.76 0.84  CALCIUM 8.8* 8.9   LFT    Latest Ref Rng & Units 06/30/2024    4:47 AM 06/29/2024    8:30 AM 06/28/2024    4:58 AM  Hepatic Function  Total Protein 6.5 - 8.1 g/dL 7.0  6.9  6.5   Albumin 3.5 - 5.0 g/dL 2.8  2.9  2.7   AST 15 - 41 U/L 40  24  19   ALT 0 - 44 U/L 20  13  11    Alk Phosphatase 38 - 126 U/L 71  71  87   Total Bilirubin 0.0 - 1.2 mg/dL 0.3  0.2  0.3    PT/INR No results for input(s): LABPROT, INR in the last 72 hours. ABG No results for input(s): PHART, HCO3 in the last 72 hours.  Invalid input(s): PCO2, PO2  Studies/Results:  Anti-infectives: Anti-infectives (From admission,  onward)    Start     Dose/Rate Route Frequency Ordered Stop   06/27/24 0000  amoxicillin -clavulanate (AUGMENTIN ) 875-125 MG tablet        1 tablet Oral 2 times daily 06/27/24 0850 07/11/24 2359   06/26/24 1000  metroNIDAZOLE  (FLAGYL ) IVPB 500 mg       Placed in And Linked Group   500 mg 100 mL/hr over 60 Minutes Intravenous Every 12 hours 06/25/24 2358     06/26/24 0800  ceFEPIme  (MAXIPIME ) 2 g in sodium chloride  0.9 % 100 mL IVPB        2 g 200 mL/hr over 30 Minutes Intravenous Every 12 hours 06/26/24 0002     06/26/24 0045  ceFEPIme  (MAXIPIME ) 2 g in sodium chloride  0.9 % 100 mL IVPB  Status:  Discontinued       Placed in And Linked Group   2 g 200 mL/hr over 30 Minutes Intravenous  Once 06/25/24 2358 06/26/24 0002   06/25/24 2015  ceFEPIme  (MAXIPIME ) 2 g in sodium chloride  0.9 % 100 mL IVPB       Placed in And Linked Group   2 g 200 mL/hr over 30 Minutes Intravenous  Once 06/25/24  2009 06/25/24 2101   06/25/24 2015  metroNIDAZOLE  (FLAGYL ) IVPB 500 mg       Placed in And Linked Group   500 mg 100 mL/hr over 60 Minutes Intravenous  Once 06/25/24 2009 06/25/24 2241       Medications: Scheduled Meds:  levothyroxine   50 mcg Oral Q0600   polyethylene glycol  17 g Oral BID   senna-docusate  1 tablet Oral BID   sodium chloride  flush  5 mL Intracatheter Q8H   Continuous Infusions:  ceFEPime  (MAXIPIME ) IV 2 g (06/30/24 0847)   lactated ringers      metronidazole  500 mg (06/29/24 2309)   PRN Meds:.acetaminophen  **OR** acetaminophen , bisacodyl , fentaNYL  (SUBLIMAZE ) injection, ondansetron  **OR** ondansetron  (ZOFRAN ) IV, oxyCODONE   Assessment/Plan: 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   Hypothyroidism 06/26/2024   Anemia 06/26/2024   Dehydration 06/26/2024   Diverticulitis of colon with perforation 06/25/2024   Cystocele with rectocele 03/16/2023   Encounter for well woman exam with routine gynecological exam 03/16/2023    Urinary frequency 03/16/2023   Encounter for screening fecal occult blood testing 08/10/2020   Visit for pelvic exam 08/10/2020   S/P hysterectomy 08/10/2020   Dysuria 08/10/2020   Hematuria 08/10/2020   Fatty liver 10/11/2016   Rectocele 08/08/2016   Pelvic floor relaxation 03/23/2015   Fatigue 04/08/2013    64 year old woman presents with 3 weeks of abdominal pain, intermittent fevers and UTI. - Diverticular abscess 6.9 x 5.3 x 6.4 cm: IV antibiotics, IR drain placed 1/21. Continue soft diet, mobilize, miralax  - Colovesical fistula: Hopefully diverticulitis will subside with above treatment and plan will be for outpatient follow-up with colorectal surgeon to discuss 1 stage resection and repair.   No fever x 24hrs Wbc stable Tolerating diet  Ok for dc from our standpoint  IR follow-up; they will put IR f/u on chart F/u dr Signe 3-4 weeks; ultimately with one of our CR surgeons Needs total of 2 weeks of abx therapy   Per [facility]: Hypothyroidism obesity Normocytic anemia Renal insufficiency/dehydration Thrombocytosis hypoalbuminemia Disposition:  Data reviewed: vitals x 36hrs, labs x 48hrs, progress notes x 48 hrs,   High MDM  LOS: 5 days   Mitzie DELENA Signe  MD, FACS General, Bariatric, & Minimally Invasive Surgery [phone]) [phone] [facility], A [facility]

## 2024-06-30 NOTE — Plan of Care (Signed)
?  Problem: Health Behavior/Discharge Planning: ?Goal: Ability to manage health-related needs will improve ?Outcome: Progressing ?  ?Problem: Activity: ?Goal: Risk for activity intolerance will decrease ?Outcome: Progressing ?  ?Problem: Elimination: ?Goal: Will not experience complications related to bowel motility ?Outcome: Progressing ?  ?

## 2024-06-30 NOTE — Progress Notes (Signed)
 " PROGRESS NOTE    Gwendolyn Sweeney  FMW:[id number] DOB: 1961-06-02 DOA: 06/25/2024 PCP: [name], MD   Brief Narrative:  The patient is a 63 year old obese Caucasian female with past medical history significant for but not limited to cystocele/rectocele, frequent UTIs, hypothyroidism and other comorbidities who presents with abdominal discomfort and pain along with fevers.  Pain has been going on for 3 weeks and she was instructed by primary care But presents with worsening.  The pain and fever was initially attributed to influenza B and UTI which she was given Macrobid  for.    She continued to have left lower quadrant abdominal discomfort despite antibiotics and went to [facility] and had a CT scan which showed diverticulitis with microperforation and a diverticular abscess along with colovesicular fistula. Transferred to [facility] for Inpt Admission.  General Surgery was consulted and she was given IV antibiotics IV fluids and admitted.  IR was consulted she is s/p LLQ Drain placement. Continued to spike intermittent temperatures so she was given IV fluids and had a blood culture repeated.  Diet is advanced to soft now.  Drain cultures still as below.  The surgical team feels that she can be safely discharged to SNF on oral Augmentin  however given her recent E. coli with resistance in her urine we will await her cultures and sensitivities from her drain to ensure we appropriately send her out on the right antibiotics given her abscess.  Assessment and Plan:  Diverticulitis of Colon with Perforation: Continue IV Cefepime  and Flagyl ; appreciate General Surgery consult and they recommended IR evaluation for drain placement given that the diverticular abscess is 6.9 x 5.3 x 6.4 cm. She is now s/p 85F drainage catheter into the LLQ Abscess (placed 06/26/24) and specimens sent for Gram Stain, Aerobic and Anaerobic Cx -Current Gram Stain + Cx:  Gram Stain FEW WBC PRESENT, PREDOMINANTLY PMN FEW GRAM POSITIVE  COCCI FEW GRAM POSITIVE RODS  Culture FEW STREPTOCOCCUS INTERMEDIUS RARE GRAM NEGATIVE RODS IDENTIFICATION AND SUSCEPTIBILITIES TO FOLLOW Performed at [facility], [address] [place], [place] [address]  -WBC Improving and went from 15.9 -> 15.4 -> 13.9 -> 12.2 -> 12.6 -> 12.1 -Has been Afebrile in the last 48 hours  -Continue IV fluid hydration as below but given her Temperature spikes yesterday Afternoon she was given two 1 Liter boluses. Remains on Soft Diet today. -Continue with supportive care continue with acetaminophen  650 mg p.o./RC every 6 as needed for mild pain, IV fentanyl  12.5 to 50 mcg Q2 as needed for severe pain.  Continue with antiemetics with ondansetron  4 mg p.o./IV every 6 as needed. Further care per General Surgery and IR -General Surgery feels that she can be discharged from their standpoint and patient has had diet progression from full liquid to soft diet.  The surgical team is recommending IR follow-up and IR follow-up is being placed on the chart.  Patient to follow-up with Dr. Evangelina in 3 to 4 weeks and ultimately with one of the colorectal surgeons.  Surgery team feels that she needs at least 2 weeks of antibiotic therapy but after discussion with ID will need to ensure she adequately gets appropriate antibiotic treatment given her cultures. -Will await her final cultures and sensitivities and clearance by the ID team to safely discharge her on the appropriate antibiotic regimen  Recent suspected E. Coli UTI: Urinalysis showed cloudy appearance with moderate hemoglobin, trace ketones, large leukocytes, negative nitrites, 100 protein, urine specific gravity of 1.029, no bacteria seen,  greater than 50 WBCs and urine culture showing E. coli 50,000 colony-forming units with resistance to ampicillin , ampicillin  sulbactam and ciprofloxacin.  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  Defer to  general surgery for further evaluation and they feel that hopefully the direct diverticulitis will subside with antibiotics, bowel rest and IR drainage and they are planning for outpatient follow-up with the colorectal surgeon to discuss 1 stage resection and repair -Continue IV Cefepime  and Flagyl  for now and transition to oral Abx when Cx Results    Hypothyroidism: TSH is 2.850. C/w po Levothyroxine  50 mcg po Daily    Normocytic Anemia: Likely dilutional drop from IVF. Hgb/Hct Trend:  Recent Labs  Lab 06/25/24 1650 06/26/24 0101 06/27/24 0505 06/28/24 0458 06/29/24 0830 06/30/24 0447  HGB 11.9* 9.9* 9.9* 9.2* 9.4* 9.5*  HCT 37.0 30.5* 30.6* 29.9* 29.9* 30.2*  MCV 86.4 85.2 87.7 89.3 87.9 88.3  -Checked Anemia Panel and showed an iron level 16, UIBC 160, TIBC 176, saturation of show 9%, ferritin level 94, folate level greater than 20.0 and Vitamin B12 was 509. CTM for S/Sx of Bleeding; No overt bleeding noted. Repeat CBC in the AM  Renal Insufficiency: Baseline Cr ~1.0. BUN/Cr went from 21/1.15 -> 19/1.08 -> 14/1.08 -> 9/0.87 -> 9/0.76 -> 9/0.84. IVF now stopped. Avoid Nephrotoxic Medications, Contrast Dyes, Hypotension and Dehydration to Ensure Adequate Renal Perfusion and will need to Renally Adjust Meds. CTM & Trend Renal Function carefully & repeat CMP in the AM    Dehydration: Noted and Improved. Mild Renal Insufficiency that is stable and slightly improved; will Rehydrate as above; Follow labs  Thrombocytosis: Likely reactive in the setting of above. Plt Count went from 546 -> 472 -> 458 -> 436 -> 457 -> 504. CTM & Trend & repeat CBC in the AM   Hypoalbuminemia: Patient's Albumin Lvl went from 3.8 -> 3.0 -> 2.9 -> 2.7 -> 2.9 -> 2.8. CTM & Trend & repeat CMP in the AM  Class I Obesity: Complicates overall prognosis and care. Estimated body mass index is 33.33 kg/m as calculated from the following:   Height as of this encounter: 4' 11 (1.499 m).   Weight as of this encounter: 74.8  kg. Weight Loss and Dietary Counseling given   DVT prophylaxis: SCDs Start: 06/26/24 0000    Code Status: Full Code Family Communication: No family present @ bedside   Disposition Plan:  Level of care: Med-Surg Status is: Inpatient Remains inpatient appropriate because: Awaiting the drain culture speciation and sensitivities given that we need to ensure that she gets put on appropriate antibiotics for discharge    Consultants:  General Surgery Interventional Radiology Discussed with ID Dr. Dennise  Procedures:  As delineated as above  Antimicrobials:  Anti-infectives (From admission, onward)    Start     Dose/Rate Route Frequency Ordered Stop   06/27/24 0000  amoxicillin -clavulanate (AUGMENTIN ) 875-125 MG tablet        1 tablet Oral 2 times daily 06/27/24 0850 07/11/24 2359   06/26/24 1000  metroNIDAZOLE  (FLAGYL ) IVPB 500 mg       Placed in And Linked Group   500 mg 100 mL/hr over 60 Minutes Intravenous Every 12 hours 06/25/24 2358     06/26/24 0800  ceFEPIme  (MAXIPIME ) 2 g in sodium chloride  0.9 % 100 mL IVPB        2 g 200 mL/hr over 30 Minutes Intravenous Every 12 hours 06/26/24 0002  06/26/24 0045  ceFEPIme  (MAXIPIME ) 2 g in sodium chloride  0.9 % 100 mL IVPB  Status:  Discontinued       Placed in And Linked Group   2 g 200 mL/hr over 30 Minutes Intravenous  Once 06/25/24 2358 06/26/24 0002   06/25/24 2015  ceFEPIme  (MAXIPIME ) 2 g in sodium chloride  0.9 % 100 mL IVPB       Placed in And Linked Group   2 g 200 mL/hr over 30 Minutes Intravenous  Once 06/25/24 2009 06/25/24 2101   06/25/24 2015  metroNIDAZOLE  (FLAGYL ) IVPB 500 mg       Placed in And Linked Group   500 mg 100 mL/hr over 60 Minutes Intravenous  Once 06/25/24 2009 06/25/24 2241       Subjective: Seen and examined at bedside he was doing okay and felt fairly well.  Still having some mild abdominal soreness.  Denies any nausea or vomiting.  No other concerns or complaints this  time.  Objective: Vitals:   06/29/24 0840 06/29/24 1700 06/29/24 2015 06/30/24 0502  BP: (!) 144/70 (!) 146/78 133/78 (!) 140/76  Pulse: 79 80 83 80  Resp: 14 16 16 16   Temp: 98.4 F (36.9 C) 98 F (36.7 C) 99.1 F (37.3 C) 98.5 F (36.9 C)  TempSrc: Oral Oral Oral Oral  SpO2: 98% 96% 97% 96%  Weight:      Height:        Intake/Output Summary (Last 24 hours) at 06/30/2024 1225 Last data filed at 06/30/2024 1033 Gross per 24 hour  Intake 1034.65 ml  Output 40 ml  Net 994.65 ml   Filed Weights   06/25/24 1646  Weight: 74.8 kg   Examination: Physical Exam:  Constitutional: WN/WD obese Caucasian female in no acute distress Respiratory: Diminished to auscultation bilaterally, no wheezing, rales, rhonchi or crackles. Normal respiratory effort and patient is not tachypenic. No accessory muscle use.  Unlabored breathing Cardiovascular: RRR, no murmurs / rubs / gallops. S1 and S2 auscultated. No extremity edema. Abdomen: Soft, non-tender, stented secondary body habitus.  Abdominal drain is in place.  Bowel sounds positive.  GU: Deferred. Musculoskeletal: No clubbing / cyanosis of digits/nails. No joint deformity upper and lower extremities. Skin: No rashes, lesions, ulcers on limited skin evaluation. No induration; Warm and dry.  Neurologic: CN 2-12 grossly intact with no focal deficits. Romberg sign and cerebellar reflexes not assessed.  Psychiatric: Normal judgment and insight. Alert and oriented x 3. Normal mood and appropriate affect.   Data Reviewed: I have personally reviewed following labs and imaging studies  CBC: Recent Labs  Lab 06/25/24 1650 06/26/24 0101 06/27/24 0505 06/28/24 0458 06/29/24 0830 06/30/24 0447  WBC 15.9* 15.4* 13.9* 12.2* 12.6* 12.1*  NEUTROABS 13.4*  --  11.1* 9.8* 10.2* 9.4*  HGB 11.9* 9.9* 9.9* 9.2* 9.4* 9.5*  HCT 37.0 30.5* 30.6* 29.9* 29.9* 30.2*  MCV 86.4 85.2 87.7 89.3 87.9 88.3  PLT 546* 472* 458* 436* 457* 504*   Basic Metabolic  Panel: Recent Labs  Lab 06/26/24 0101 06/27/24 0505 06/28/24 0458 06/29/24 0830 06/30/24 0447  NA 136 137 139 139 140  K 4.4 4.4 3.7 3.7 3.7  CL 98 101 104 102 102  CO2 27 25 26 27 29   GLUCOSE 87 78 87 92 102*  BUN 19 14 9 9 9   CREATININE 1.08* 1.08* 0.87 0.76 0.84  CALCIUM 9.0 9.2 8.7* 8.8* 8.9  MG 2.1 2.2 2.1 1.9 2.2  PHOS 4.0 4.3 3.0 3.0 3.6  GFR: Estimated Creatinine Clearance: 58.8 mL/min (by C-G formula based on SCr of 0.84 mg/dL). Liver Function Tests: Recent Labs  Lab 06/26/24 0101 06/27/24 0505 06/28/24 0458 06/29/24 0830 06/30/24 0447  AST 21 19 19 24  40  ALT 17 13 11 13 20   ALKPHOS 101 99 87 71 71  BILITOT 0.5 0.4 0.3 0.2 0.3  PROT 7.4 7.1 6.5 6.9 7.0  ALBUMIN 3.0* 2.9* 2.7* 2.9* 2.8*   No results for input(s): LIPASE, AMYLASE in the last 168 hours. No results for input(s): AMMONIA in the last 168 hours. Coagulation Profile: Recent Labs  Lab 06/25/24 2139  INR 1.2   Cardiac Enzymes: No results for input(s): CKTOTAL, CKMB, CKMBINDEX, TROPONINI in the last 168 hours. BNP (last 3 results) No results for input(s): PROBNP in the last 8760 hours. HbA1C: No results for input(s): HGBA1C in the last 72 hours. CBG: No results for input(s): GLUCAP in the last 168 hours. Lipid Profile: No results for input(s): CHOL, HDL, LDLCALC, TRIG, CHOLHDL, LDLDIRECT in the last 72 hours. Thyroid  Function Tests: No results for input(s): TSH, T4TOTAL, FREET4, T3FREE, THYROIDAB in the last 72 hours. Anemia Panel: No results for input(s): VITAMINB12, FOLATE, FERRITIN, TIBC, IRON, RETICCTPCT in the last 72 hours. Sepsis Labs: Recent Labs  Lab 06/25/24 1652  LATICACIDVEN 1.2   Recent Results (from the past 240 hours)  Urine Culture     Status: Abnormal   Collection Time: 06/25/24  6:34 PM   Specimen: Urine, Clean Catch  Result Value Ref Range Status   Specimen Description   Final    URINE, CLEAN CATCH Performed  at [facility], [address] [place], [place] [address]    Special Requests   Final    NONE Performed at [facility], [address] [place], [place] [address]    Culture 50,000 COLONIES/mL ESCHERICHIA COLI (A)  Final   Report Status 06/27/2024 FINAL  Final   Organism ID, Bacteria ESCHERICHIA COLI (A)  Final      Susceptibility   Escherichia coli - MIC*    AMPICILLIN  >=32 RESISTANT Resistant     CEFAZOLIN (URINE) Value in next row Sensitive      8 SENSITIVEThis is a modified FDA-approved test that has been validated and its performance characteristics determined by the reporting laboratory.  This laboratory is certified under the Clinical Laboratory Improvement Amendments CLIA as qualified to perform high complexity clinical laboratory testing.    CEFEPIME  Value in next row Sensitive      8 SENSITIVEThis is a modified FDA-approved test that has been validated and its performance characteristics determined by the reporting laboratory.  This laboratory is certified under the Clinical Laboratory Improvement Amendments CLIA as qualified to perform high complexity clinical laboratory testing.    ERTAPENEM Value in next row Sensitive      8 SENSITIVEThis is a modified FDA-approved test that has been validated and its performance characteristics determined by the reporting laboratory.  This laboratory is certified under the Clinical Laboratory Improvement Amendments CLIA as qualified to perform high complexity clinical laboratory testing.    CEFTRIAXONE  Value in next row Sensitive      8 SENSITIVEThis is a modified FDA-approved test that has been validated and its performance characteristics determined by the reporting laboratory.  This laboratory is certified under the Clinical Laboratory Improvement Amendments CLIA as qualified to perform high complexity clinical laboratory testing.    CIPROFLOXACIN Value in next row Resistant      8 SENSITIVEThis is a  modified FDA-approved test that has been validated and its performance characteristics determined by the reporting laboratory.  This laboratory is certified under the Clinical Laboratory Improvement Amendments CLIA as qualified to perform high complexity clinical laboratory testing.    GENTAMICIN Value in next row Sensitive      8 SENSITIVEThis is a modified FDA-approved test that has been validated and its performance characteristics determined by the reporting laboratory.  This laboratory is certified under the Clinical Laboratory Improvement Amendments CLIA as qualified to perform high complexity clinical laboratory testing.    NITROFURANTOIN  Value in next row Sensitive      8 SENSITIVEThis is a modified FDA-approved test that has been validated and its performance characteristics determined by the reporting laboratory.  This laboratory is certified under the Clinical Laboratory Improvement Amendments CLIA as qualified to perform high complexity clinical laboratory testing.    TRIMETH /SULFA  Value in next row Sensitive      8 SENSITIVEThis is a modified FDA-approved test that has been validated and its performance characteristics determined by the reporting laboratory.  This laboratory is certified under the Clinical Laboratory Improvement Amendments CLIA as qualified to perform high complexity clinical laboratory testing.    AMPICILLIN /SULBACTAM Value in next row Resistant      8 SENSITIVEThis is a modified FDA-approved test that has been validated and its performance characteristics determined by the reporting laboratory.  This laboratory is certified under the Clinical Laboratory Improvement Amendments CLIA as qualified to perform high complexity clinical laboratory testing.    PIP/TAZO Value in next row Sensitive      <=4 SENSITIVEThis is a modified FDA-approved test that has been validated and its performance characteristics determined by the reporting laboratory.  This laboratory is certified under  the Clinical Laboratory Improvement Amendments CLIA as qualified to perform high complexity clinical laboratory testing.    MEROPENEM Value in next row Sensitive      <=4 SENSITIVEThis is a modified FDA-approved test that has been validated and its performance characteristics determined by the reporting laboratory.  This laboratory is certified under the Clinical Laboratory Improvement Amendments CLIA as qualified to perform high complexity clinical laboratory testing.    * 50,000 COLONIES/mL ESCHERICHIA COLI  Blood culture (routine x 2)     Status: None   Collection Time: 06/25/24  6:35 PM   Specimen: BLOOD  Result Value Ref Range Status   Specimen Description   Final    BLOOD RIGHT ANTECUBITAL Performed at [facility], [address], [place], [place] [address]    Special Requests   Final    Blood Culture adequate volume BOTTLES DRAWN AEROBIC AND ANAEROBIC Performed at [facility], [address] [place], [place] [address]    Culture   Final    NO GROWTH 5 DAYS Performed at [facility], [address] [place], [place] [address]    Report Status 06/30/2024 FINAL  Final  Blood culture (routine x 2)     Status: None   Collection Time: 06/25/24  6:40 PM   Specimen: BLOOD RIGHT HAND  Result Value Ref Range Status   Specimen Description   Final    BLOOD RIGHT HAND Performed at [facility], [address] [place], [place] [address]    Special Requests   Final    Blood Culture adequate volume BOTTLES DRAWN AEROBIC AND ANAEROBIC Performed at [facility], [address], [place], [place] [address]    Culture   Final    NO  GROWTH 5 DAYS Performed at [facility], [address] [place], [place] [address]    Report Status 06/30/2024 FINAL  Final  Resp panel by RT-PCR (RSV, Flu A&B, Covid) Anterior Nasal Swab     Status: None   Collection Time: 06/25/24  7:01 PM   Specimen: Anterior Nasal Swab  Result  Value Ref Range Status   SARS Coronavirus 2 by RT PCR NEGATIVE NEGATIVE Final    Comment: (NOTE) SARS-CoV-2 target nucleic acids are NOT DETECTED.  The SARS-CoV-2 RNA is generally detectable in upper respiratory specimens during the acute phase of infection. The lowest concentration of SARS-CoV-2 viral copies this assay can detect is 138 copies/mL. A negative result does not preclude SARS-Cov-2 infection and should not be used as the sole basis for treatment or other patient management decisions. A negative result may occur with  improper specimen collection/handling, submission of specimen other than nasopharyngeal swab, presence of viral mutation(s) within the areas targeted by this assay, and inadequate number of viral copies(<138 copies/mL). A negative result must be combined with clinical observations, patient history, and epidemiological information. The expected result is Negative.  Fact Sheet for Patients:  bloggercourse.com  Fact Sheet for Healthcare Providers:  seriousbroker.it  This test is no t yet approved or cleared by the [place]  FDA and  has been authorized for detection and/or diagnosis of SARS-CoV-2 by FDA under an Emergency Use Authorization (EUA). This EUA will remain  in effect (meaning this test can be used) for the duration of the COVID-19 declaration under Section 564(b)(1) of the Act, 21 U.S.C.section 360bbb-3(b)(1), unless the authorization is terminated  or revoked sooner.       Influenza A by PCR NEGATIVE NEGATIVE Final   Influenza B by PCR NEGATIVE NEGATIVE Final    Comment: (NOTE) The Xpert Xpress SARS-CoV-2/FLU/RSV plus assay is intended as an aid in the diagnosis of influenza from Nasopharyngeal swab specimens and should not be used as a sole basis for treatment. Nasal washings and aspirates are unacceptable for Xpert Xpress SARS-CoV-2/FLU/RSV testing.  Fact Sheet for  Patients: bloggercourse.com  Fact Sheet for Healthcare Providers: seriousbroker.it  This test is not yet approved or cleared by the [place]  FDA and has been authorized for detection and/or diagnosis of SARS-CoV-2 by FDA under an Emergency Use Authorization (EUA). This EUA will remain in effect (meaning this test can be used) for the duration of the COVID-19 declaration under Section 564(b)(1) of the Act, 21 U.S.C. section 360bbb-3(b)(1), unless the authorization is terminated or revoked.     Resp Syncytial Virus by PCR NEGATIVE NEGATIVE Final    Comment: (NOTE) Fact Sheet for Patients: bloggercourse.com  Fact Sheet for Healthcare Providers: seriousbroker.it  This test is not yet approved or cleared by the [place]  FDA and has been authorized for detection and/or diagnosis of SARS-CoV-2 by FDA under an Emergency Use Authorization (EUA). This EUA will remain in effect (meaning this test can be used) for the duration of the COVID-19 declaration under Section 564(b)(1) of the Act, 21 U.S.C. section 360bbb-3(b)(1), unless the authorization is terminated or revoked.  Performed at [facility], [address] [place], [place] [address]   Aerobic/Anaerobic Culture w Gram Stain (surgical/deep wound)     Status: None (Preliminary result)   Collection Time: 06/26/24  2:26 PM   Specimen: Abscess  Result Value Ref Range Status   Specimen Description   Final    ABSCESS Performed at [facility], [address]  [place], [place] [address]    Special Requests   Final    NONE Performed at [facility], [address] [place], [place] [address]    Gram Stain   Final    FEW WBC PRESENT, PREDOMINANTLY PMN FEW GRAM POSITIVE COCCI FEW GRAM POSITIVE RODS    Culture   Final    FEW STREPTOCOCCUS INTERMEDIUS RARE GRAM  NEGATIVE RODS IDENTIFICATION AND SUSCEPTIBILITIES TO FOLLOW Performed at [facility], [address] [place], [place] [address]    Report Status PENDING  Incomplete  Culture, blood (Routine X 2) w Reflex to ID Panel     Status: None (Preliminary result)   Collection Time: 06/27/24  7:42 PM   Specimen: BLOOD RIGHT ARM  Result Value Ref Range Status   Specimen Description   Final    BLOOD RIGHT ARM Performed at [facility], [address] [place], [place] [address]    Special Requests   Final    BOTTLES DRAWN AEROBIC AND ANAEROBIC Blood Culture adequate volume Performed at [facility], [address] [place], [place] [address]    Culture   Final    NO GROWTH 3 DAYS Performed at [facility], [address] [place], [place] [address]    Report Status PENDING  Incomplete  Culture, blood (Routine X 2) w Reflex to ID Panel     Status: None (Preliminary result)   Collection Time: 06/27/24  7:42 PM   Specimen: BLOOD RIGHT HAND  Result Value Ref Range Status   Specimen Description   Final    BLOOD RIGHT HAND Performed at [facility], [address] [place], [place] [address]    Special Requests   Final    BOTTLES DRAWN AEROBIC AND ANAEROBIC Blood Culture results may not be optimal due to an inadequate volume of blood received in culture bottles Performed at [facility], [address] [place], [place] [address]    Culture   Final    NO GROWTH 3 DAYS Performed at [facility], [address], [place], [place] [address]    Report Status PENDING  Incomplete    Radiology Studies: No results found.  Scheduled Meds:  levothyroxine   50 mcg Oral Q0600   polyethylene glycol  17 g Oral BID   senna-docusate  1 tablet Oral BID   sodium chloride  flush  5 mL Intracatheter Q8H   Continuous Infusions:  ceFEPime  (MAXIPIME ) IV 2 g (06/30/24 0847)   lactated ringers      metronidazole  500 mg (06/30/24 1033)     LOS: 5 days   Alejandro Marker, DO [facility] Available via Epic secure chat 7am-7pm After these hours, please refer to coverage provider listed on [email or web address] 06/30/2024, 12:25 PM  "

## 2024-06-30 NOTE — Progress Notes (Signed)
 Pt requested medication for sleep. Messaged Lavanda Horns, NP with this. Order received.

## 2024-07-01 ENCOUNTER — Other Ambulatory Visit: Payer: Self-pay

## 2024-07-01 ENCOUNTER — Other Ambulatory Visit (HOSPITAL_COMMUNITY): Payer: Self-pay

## 2024-07-01 DIAGNOSIS — K651 Peritoneal abscess: Secondary | ICD-10-CM

## 2024-07-01 DIAGNOSIS — B9620 Unspecified Escherichia coli [E. coli] as the cause of diseases classified elsewhere: Secondary | ICD-10-CM | POA: Diagnosis not present

## 2024-07-01 DIAGNOSIS — B954 Other streptococcus as the cause of diseases classified elsewhere: Secondary | ICD-10-CM

## 2024-07-01 DIAGNOSIS — B9689 Other specified bacterial agents as the cause of diseases classified elsewhere: Secondary | ICD-10-CM | POA: Diagnosis not present

## 2024-07-01 DIAGNOSIS — K5720 Diverticulitis of large intestine with perforation and abscess without bleeding: Secondary | ICD-10-CM | POA: Diagnosis not present

## 2024-07-01 DIAGNOSIS — A499 Bacterial infection, unspecified: Secondary | ICD-10-CM

## 2024-07-01 MED ORDER — SODIUM CHLORIDE 0.9% FLUSH: 10.0000 mL | INTRAVENOUS | Status: DC | PRN

## 2024-07-01 MED ORDER — CEFTRIAXONE IV (FOR PTA / DISCHARGE USE ONLY): 2.0000 g | INTRAVENOUS | 0 refills | Status: AC

## 2024-07-01 MED ORDER — OXYCODONE HCL 5 MG PO TABS
5.0000 mg | ORAL_TABLET | Freq: Four times a day (QID) | ORAL | 0 refills | Status: AC | PRN
  Filled 2024-07-01: qty 12, 3d supply, fill #0

## 2024-07-01 MED ORDER — SENNOSIDES-DOCUSATE SODIUM 8.6-50 MG PO TABS
1.0000 | ORAL_TABLET | Freq: Every evening | ORAL | 0 refills | Status: AC | PRN
  Filled 2024-07-01: qty 30, 30d supply, fill #0

## 2024-07-01 MED ORDER — SODIUM CHLORIDE 0.9 % IV SOLN
2.0000 g | INTRAVENOUS | Status: DC
  Administered 2024-07-01: 2 g via INTRAVENOUS
  Filled 2024-07-01: qty 20

## 2024-07-01 MED ORDER — HYDROCORTISONE (PERIANAL) 2.5 % EX CREA
1.0000 | TOPICAL_CREAM | Freq: Two times a day (BID) | CUTANEOUS | Status: DC
  Administered 2024-07-01: 1 via RECTAL
  Filled 2024-07-01: qty 28.35

## 2024-07-01 MED ORDER — METRONIDAZOLE 500 MG PO TABS
500.0000 mg | ORAL_TABLET | Freq: Two times a day (BID) | ORAL | 0 refills | Status: AC
  Filled 2024-07-01: qty 42, 21d supply, fill #0

## 2024-07-01 MED ORDER — CHLORHEXIDINE GLUCONATE CLOTH 2 % EX PADS
6.0000 | MEDICATED_PAD | Freq: Every day | CUTANEOUS | Status: DC
  Administered 2024-07-01: 6 via TOPICAL

## 2024-07-01 MED ORDER — POLYETHYLENE GLYCOL 3350 17 GM/SCOOP PO POWD
17.0000 g | Freq: Every day | ORAL | 0 refills | Status: AC
  Filled 2024-07-01: qty 238, 14d supply, fill #0

## 2024-07-01 MED ORDER — ACETAMINOPHEN 325 MG PO TABS
650.0000 mg | ORAL_TABLET | Freq: Four times a day (QID) | ORAL | 0 refills | Status: AC | PRN
  Filled 2024-07-01: qty 20, 3d supply, fill #0

## 2024-07-01 MED ORDER — SODIUM CHLORIDE 0.9% FLUSH
10.0000 mL | Freq: Every day | INTRAVENOUS | 0 refills | Status: AC
  Filled 2024-07-01: qty 600, 60d supply, fill #0

## 2024-07-01 MED ORDER — HYDROCORTISONE (PERIANAL) 2.5 % EX CREA
1.0000 | TOPICAL_CREAM | Freq: Two times a day (BID) | CUTANEOUS | 0 refills | Status: AC
  Filled 2024-07-01: qty 30, 10d supply, fill #0

## 2024-07-01 MED ORDER — ONDANSETRON HCL 4 MG PO TABS
4.0000 mg | ORAL_TABLET | Freq: Four times a day (QID) | ORAL | 0 refills | Status: AC | PRN
  Filled 2024-07-01: qty 20, 5d supply, fill #0

## 2024-07-01 MED ORDER — METRONIDAZOLE 500 MG PO TABS: 500.0000 mg | ORAL_TABLET | Freq: Two times a day (BID) | ORAL | Status: DC

## 2024-07-01 NOTE — Progress Notes (Signed)
 Additional Discharge medications delivered to patient at the bedside

## 2024-07-01 NOTE — Progress Notes (Signed)
 Discharge medications delivered to patient at the bedside in a secure bag.

## 2024-07-01 NOTE — Progress Notes (Addendum)
 "   Referring Provider(s): Dr. Mitzie Freund, MD.  Supervising Physician: Luverne Aran  Patient Status:  [facility] - In-pt  Chief Complaint: Abdominal pain secondary to diverticulitis of colon with perforation status post drain placement  Brief History: Patient presented to ED on 1/20 with complaints of 3 weeks of abdominal pain and fevers.  She had recently been diagnosed by PCP with influenza B and a UTI, but did not improve with treatment for both, and was advised to present to ED by PCP.  Imaging in the ED was concerning for a diverticular abscess with colovesical fistula.  She has been seen by General surgery who has recommended temporization until eventual resection and repair. IR was consulted for aspiration and drainage, and patient received a drain on 1/21 by Dr. Hughes. Patient is up for discharge today.  Subjective:  Patient alert and laying in bed, calm.  Her husband is at the bedside. Patient continues to feel improved since drain placement, but notes she experiences occasional discomfort from it, especially with specific movement, orr with drain bulb hanging.  Patient denies any fevers, headache, chest pain, SOB, cough, abdominal pain, nausea, vomiting or bleeding.   Allergies: Hydrocodone and Ampicillin   Medications: Prior to Admission medications  Medication Sig Start Date End Date Taking? Authorizing Provider  amoxicillin -clavulanate (AUGMENTIN ) 875-125 MG tablet Take 1 tablet by mouth 2 (two) times daily for 14 days. 06/27/24 07/11/24 Yes Freund Mitzie LABOR, MD  Ascorbic Acid (VITAMIN C PO) Take by mouth.   Yes [provider]  benzonatate (TESSALON) 200 MG capsule Take 200 mg by mouth 3 (three) times daily as needed. 06/13/24  Yes [provider]  levothyroxine  (SYNTHROID ) 50 MCG tablet Take 50 mcg by mouth daily before breakfast.   Yes [provider]  Multiple Vitamin (MULTIVITAMIN) capsule Take 1 capsule by mouth daily.   Yes [provider]  cetirizine  (ZYRTEC  ALLERGY) 10 MG tablet Take 1 tablet (10 mg total) by mouth daily. Patient not taking: Reported on 06/25/2024 01/01/19   Signa Nest A, NP  levocetirizine (XYZAL ) 5 MG tablet Take 1 tablet (5 mg total) by mouth every evening. Patient not taking: Reported on 06/25/2024 05/29/22   Leath-Warren, Etta PARAS, NP  sulfamethoxazole -trimethoprim  (BACTRIM  DS) 800-160 MG tablet Take 1 tablet by mouth 2 (two) times daily. Take 1 bid Patient not taking: Reported on 06/25/2024 04/30/24   Signa Nest LABOR, NP     Vital Signs: BP (!) 146/70 (BP Location: Left Arm)   Pulse 76   Temp 99.1 F (37.3 C) (Oral)   Resp 18   Ht 4' 11 (1.499 m)   Wt 165 lb (74.8 kg)   SpO2 98%   BMI 33.33 kg/m   Physical Exam Constitutional:      Appearance: Normal appearance.  Cardiovascular:     Rate and Rhythm: Normal rate.  Pulmonary:     Effort: Pulmonary effort is normal.  Abdominal:     General: Abdomen is flat. There is no distension.     Palpations: Abdomen is soft.     Tenderness: There is abdominal tenderness.     Comments: LLQ drain appropriately dressed. Dressing is clean, dry, intact. Drain incision site minimally tender, without evidence of infection. Retaining suture and Stat Lock in place.  Approximately 1 mL of tan purulent output in collection bulb. Line flushes well.     Musculoskeletal:        General: Normal range of motion.  Skin:    General: Skin is warm  and dry.  Neurological:     Mental Status: She is alert and oriented to person, place, and time.      Labs:  CBC: Recent Labs    06/27/24 0505 06/28/24 0458 06/29/24 0830 06/30/24 0447  WBC 13.9* 12.2* 12.6* 12.1*  HGB 9.9* 9.2* 9.4* 9.5*  HCT 30.6* 29.9* 29.9* 30.2*  PLT 458* 436* 457* 504*    COAGS: Recent Labs    06/25/24 2139  INR 1.2    BMP: Recent Labs    06/27/24 0505 06/28/24 0458 06/29/24 0830 06/30/24 0447  NA 137 139 139 140  K 4.4 3.7 3.7 3.7  CL 101 104 102 102   CO2 25 26 27 29   GLUCOSE 78 87 92 102*  BUN 14 9 9 9   CALCIUM 9.2 8.7* 8.8* 8.9  CREATININE 1.08* 0.87 0.76 0.84  GFRNONAA 57* >60 >60 >60    LIVER FUNCTION TESTS: Recent Labs    06/27/24 0505 06/28/24 0458 06/29/24 0830 06/30/24 0447  BILITOT 0.4 0.3 0.2 0.3  AST 19 19 24  40  ALT 13 11 13 20   ALKPHOS 99 87 71 71  PROT 7.1 6.5 6.9 7.0  ALBUMIN 2.9* 2.7* 2.9* 2.8*    Assessment and Plan: Colonic diverticular abscess status post drain placement.   Drain Location: LLQ Size: Fr size: 12 Fr Date of placement: 06/26/2024  Currently to: Drain collection device: suction bulb 24 hour output:  Output by Drain (mL) 06/29/24 0701 - 06/29/24 1900 06/29/24 1901 - 06/30/24 0700 06/30/24 0701 - 06/30/24 1900 06/30/24 1901 - 07/01/24 0700 07/01/24 0701 - 07/01/24 1629  Closed System Drain 1 Left;Anterior Abdomen  20 20 15  40    Interval imaging/drain manipulation:  None.  Current examination: Flushes/aspirates easily.  Insertion site unremarkable. Suture and stat lock in place. Dressed appropriately.   Plan: Patient and her husband were provided an opportunity for drain care education.  Answered patient and her husband's questions with regards to outpatient drain care and follow-up.   Continue TID flushes with 5 cc NS. Record output Q shift. Dressing changes QD or PRN if soiled.  Call IR APP or on call IR MD if difficulty flushing or sudden change in drain output.  Repeat imaging/possible drain injection once output < 10 mL/QD (excluding flush material). Consideration for drain removal if output is < 10 mL/QD (excluding flush material), pending discussion with the providing surgical service.  Discharge planning: Patient is up for discharge today. Patient and her husband have been informed of upcoming outpatient follow-up steps. They know to expect a call from IR schedulers for follow-up in 10-14 days in clinic. Patient knows to flush her drain daily, and record output  daily. Patient's husband has been instructed on drain flushing and care. Patient knows to obtain her flushes from [facility] pharmacy. They are not available at retail pharmacies. Patient knows to change her dressing every 48-72 hours, or anytime dressing is soiled or wet. Patient knows not to get her drain wet.  Patient knows to reach out to radiology with any concerns for pain or drain malposition.  All questions answered.  IR will continue to follow - please call with questions or concerns.    Thank you for this interesting consult.  I greatly enjoyed meeting LATANA COLIN and look forward to participating in their care.   Electronically Signed: Carlin DELENA Griffon, PA-C 07/01/2024, 4:29 PM     I spent a total of 15 Minutes at the the patient's bedside AND on  the patient's hospital floor or unit, greater than 50% of which was counseling/coordinating care for LLQ drain care and follow-up.  "

## 2024-07-01 NOTE — Progress Notes (Signed)
 Patient ID: Gwendolyn Sweeney, female   DOB: 16-Jun-1960, 64 y.o.   MRN: [id number]   Acute Care Surgery Service Progress Note:    Chief Complaint/Subjective: Feels well, has some discomfort at the drain site when she moves around but otherwise no abdominal pain.  Having bowel movements and tolerating a diet.  No fever.   Objective: Vital signs in last 24 hours: Temp:  [98.3 F (36.8 C)-99.1 F (37.3 C)] 99.1 F (37.3 C) (01/26 0537) Pulse Rate:  [73-85] 85 (01/26 0537) Resp:  [16-20] 20 (01/26 0537) BP: (134-140)/(69-77) 134/69 (01/26 0537) SpO2:  [95 %-97 %] 95 % (01/26 0537) Last BM Date : 06/30/24  Intake/Output from previous day: 01/25 0701 - 01/26 0700 In: 1255 [P.O.:840; IV Piggyback:400] Out: 35 [Drains:35] Intake/Output this shift: Total I/O In: -  Out: 30 [Drains:30]  Lungs:  nonlabored  Cardiovascular: reg  Abd: soft, obese, nd, min to NT; drain -now purulent  Extremities: no edema, +SCDs  Neuro: alert, nonfocal  Lab Results: CBC  Recent Labs    06/29/24 0830 06/30/24 0447  WBC 12.6* 12.1*  HGB 9.4* 9.5*  HCT 29.9* 30.2*  PLT 457* 504*   BMET Recent Labs    06/29/24 0830 06/30/24 0447  NA 139 140  K 3.7 3.7  CL 102 102  CO2 27 29  GLUCOSE 92 102*  BUN 9 9  CREATININE 0.76 0.84  CALCIUM 8.8* 8.9   LFT    Latest Ref Rng & Units 06/30/2024    4:47 AM 06/29/2024    8:30 AM 06/28/2024    4:58 AM  Hepatic Function  Total Protein 6.5 - 8.1 g/dL 7.0  6.9  6.5   Albumin 3.5 - 5.0 g/dL 2.8  2.9  2.7   AST 15 - 41 U/L 40  24  19   ALT 0 - 44 U/L 20  13  11    Alk Phosphatase 38 - 126 U/L 71  71  87   Total Bilirubin 0.0 - 1.2 mg/dL 0.3  0.2  0.3    PT/INR No results for input(s): LABPROT, INR in the last 72 hours. ABG No results for input(s): PHART, HCO3 in the last 72 hours.  Invalid input(s): PCO2, PO2  Studies/Results:  Anti-infectives: Anti-infectives (From admission, onward)    Start     Dose/Rate Route Frequency  Ordered Stop   06/27/24 0000  amoxicillin -clavulanate (AUGMENTIN ) 875-125 MG tablet        1 tablet Oral 2 times daily 06/27/24 0850 07/11/24 2359   06/26/24 1000  metroNIDAZOLE  (FLAGYL ) IVPB 500 mg       Placed in And Linked Group   500 mg 100 mL/hr over 60 Minutes Intravenous Every 12 hours 06/25/24 2358     06/26/24 0800  ceFEPIme  (MAXIPIME ) 2 g in sodium chloride  0.9 % 100 mL IVPB        2 g 200 mL/hr over 30 Minutes Intravenous Every 12 hours 06/26/24 0002     06/26/24 0045  ceFEPIme  (MAXIPIME ) 2 g in sodium chloride  0.9 % 100 mL IVPB  Status:  Discontinued       Placed in And Linked Group   2 g 200 mL/hr over 30 Minutes Intravenous  Once 06/25/24 2358 06/26/24 0002   06/25/24 2015  ceFEPIme  (MAXIPIME ) 2 g in sodium chloride  0.9 % 100 mL IVPB       Placed in And Linked Group   2 g 200 mL/hr over 30 Minutes Intravenous  Once 06/25/24 2009 06/25/24 2101  06/25/24 2015  metroNIDAZOLE  (FLAGYL ) IVPB 500 mg       Placed in And Linked Group   500 mg 100 mL/hr over 60 Minutes Intravenous  Once 06/25/24 2009 06/25/24 2241       Medications: Scheduled Meds:  levothyroxine   50 mcg Oral Q0600   polyethylene glycol  17 g Oral BID   senna-docusate  1 tablet Oral BID   sodium chloride  flush  5 mL Intracatheter Q8H   Continuous Infusions:  ceFEPime  (MAXIPIME ) IV 2 g (07/01/24 0800)   lactated ringers      metronidazole  500 mg (06/30/24 2136)   PRN Meds:.acetaminophen  **OR** acetaminophen , bisacodyl , fentaNYL  (SUBLIMAZE ) injection, ondansetron  **OR** ondansetron  (ZOFRAN ) IV, oxyCODONE   Assessment/Plan: 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   Hypothyroidism 06/26/2024   Anemia 06/26/2024   Dehydration 06/26/2024   Diverticulitis of colon with perforation 06/25/2024   Cystocele with rectocele 03/16/2023   Encounter for well woman exam with routine gynecological exam 03/16/2023   Urinary frequency 03/16/2023   Encounter for  screening fecal occult blood testing 08/10/2020   Visit for pelvic exam 08/10/2020   S/P hysterectomy 08/10/2020   Dysuria 08/10/2020   Hematuria 08/10/2020   Fatty liver 10/11/2016   Rectocele 08/08/2016   Pelvic floor relaxation 03/23/2015   Fatigue 04/08/2013    64 year old woman presents with 3 weeks of abdominal pain, intermittent fevers and UTI. - Diverticular abscess 6.9 x 5.3 x 6.4 cm: IV antibiotics, IR drain placed 1/21. Continue soft diet, mobilize, miralax  - Colovesical fistula: Hopefully diverticulitis will subside with above treatment and plan will be for outpatient follow-up with colorectal surgeon to discuss 1 stage resection and repair.   No fever x 24hrs Wbc stable Tolerating diet  Ok for dc from our standpoint  IR follow-up; they will put IR f/u on chart F/u dr Signe 3-4 weeks; ultimately with one of our CR surgeons Needs total of 2 weeks of abx therapy   Per [facility]: Hypothyroidism obesity Normocytic anemia Renal insufficiency/dehydration Thrombocytosis hypoalbuminemia Disposition:  Data reviewed: vitals x 36hrs, labs x 48hrs, progress notes x 48 hrs,   low MDM  LOS: 6 days   Camellia Blush  MD, FACS General, Bariatric, & Minimally Invasive Surgery [phone]) [phone] [facility]

## 2024-07-01 NOTE — Consult Note (Addendum)
 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 coli and Streptococcus intermedius and FUSOBACTERIUM isolated since then so important she take the oral flagyl   Allergies[1]  OPAT Orders Discharge antibiotics to be given via PICC line Discharge antibiotics: Ceftriaxone  2 g IV every 24 hours along with oral Flagyl  500 mg twice daily Duration: 3 weeks End Date: 07/22/2024  [facility] Care Per Protocol:  Home health RN for IV administration and teaching; PICC line care and labs.    Labs weekly while on IV antibiotics: x__ CBC with differential _x_ BMP   _x_ Please pull PIC at completion of IV antibiotics __ Please leave PIC in place until doctor has seen patient or been notified  Fax weekly labs to [phone]  Clinic Follow Up Appt:  Gwendolyn Sweeney has an appointment on 07/15/2024 at 215PM with Corean Fireman, NP at  [facility] for Infectious Disease, which  is located in the [facility] at  [address] in [place].  [address], which is located to the left of the elevators.  Phone: [phone]  Fax: [phone]  [email or web address]  The patient should arrive 30 minutes prior to their appoitment.    HPI: Gwendolyn Sweeney 64 year old woman with a history significant for  cystocele rectocele hypothyroidism frequent urinary tract infections who was admitted the hospital with severe abdominal discomfort and ongoing fevers.  Pain had been present for 3 weeks prior.  CT scan was performed which revealed acute sigmoid diverticulitis with microperforation and an adjacent multiloculated abscess measuring 6.9 x 5.3 x 6.4 cm.  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  She was placed on cefepime  and metronidazole  on admission.  Blood cultures were taken which failed to grow any organism.  Urine culture yielded 50,000 colony-forming units of an amp resistant Cipro resistant amp sulbactam resistant E. Coli.  She then underwent IR guided aspiration of the abscess with large amount of pus removed and drain placed and E. coli with essentially identical sensitivities from that found in the urine was discovered the only difference is that this 1 was considered cefazolin resistant due to different MIC criteria for urine versus nonurinary sites.  There is also a Streptococcus intermedius species with susceptibilities pending.    The patient was followed closely by surgery and ultimately is going to need additional surgical intervention it sounds like.  I do think she needs more protracted antibiotics and oral options are not abundant here.  I also think since she has been tolerating IVs well it would be reasonable send her home with IV antibiotics.  We are currently looking at 2 different possible regimens 1 would be ceftriaxone  2 g IV daily with twice daily oral metronidazole .  The other option we talked about was once daily  Invanz which would take care of all of the organisms that she has plus the anaerobes that we would be covering with metronidazole .  This would be an all-in-one once daily IV antibiotic that would take care of all of the bacteria that we know are growing as well as  anaerobes.  The downside is it might be more expensive and it is unnecessarily broad and certainly should this patient have an ongoing persistent abscess that would be risk of carbapenem resistance which we do not want.  After further deliberation I felt that it was best to do the ceftriaxone  option with oral metronidazole  and we will proceed with that  At this point in time Gwendolyn Sweeney is looking cost differences. I personally spent a total of 80 minutes in the care of the patient today including preparing to see the patient, getting/reviewing separately obtained history, performing a medically appropriate exam/evaluation, counseling and educating, placing orders, referring and communicating with other health care professionals, documenting clinical information in the EHR, independently interpreting results, communicating results, and coordinating care.   Evaluation of the patient requires complex antimicrobial therapy evaluation, counseling , isolation needs to reduce disease transmission and risk assessment and mitigation.     Review of Systems: Review of Systems  Constitutional:  Negative for chills, fever, malaise/fatigue and weight loss.  HENT:  Negative for congestion and sore throat.   Eyes:  Negative for blurred vision and photophobia.  Respiratory:  Negative for cough, shortness of breath and wheezing.   Cardiovascular:  Negative for chest pain, palpitations and leg swelling.  Gastrointestinal:  Positive for abdominal pain and nausea. Negative for blood in stool, constipation, diarrhea, heartburn, melena and vomiting.  Genitourinary:  Negative for dysuria, flank pain and hematuria.  Musculoskeletal:  Negative for back pain, falls, joint pain and myalgias.  Skin:  Negative for itching and rash.  Neurological:  Positive for weakness. Negative for dizziness, focal weakness, loss of consciousness and headaches.  Endo/Heme/Allergies:  Does not bruise/bleed easily.   Psychiatric/Behavioral:  Negative for depression and suicidal ideas. The patient does not have insomnia.     Past Medical History:  Diagnosis Date   Fatigue 04/08/2013   Fatty liver 10/11/2016   Pelvic floor relaxation 03/23/2015   Thyroid  disease    Ulcer    Urinary tract bacterial infections     Social History[2]  Family History  Problem Relation Age of Onset   Heart disease Mother    Heart disease Father    Diabetes Daughter    Diabetes Paternal Grandfather    Heart disease Paternal Grandfather    Macular degeneration Paternal Grandfather    Glaucoma Maternal Grandmother    Macular degeneration Sister    Other Other        paternal 2nd cousin-tested + for gene for breast cancer   Allergies[3]  OBJECTIVE: Blood pressure 134/69, pulse 85, temperature 99.1 F (37.3 C), temperature source Oral, resp. rate 20, height 4' 11 (1.499 m), weight 74.8 kg, SpO2 95%.  Physical Exam Constitutional:      General: She is not in acute distress.    Appearance: Normal appearance. She is well-developed. She is not ill-appearing or diaphoretic.  HENT:     Head: Normocephalic and atraumatic.     Right Ear: Hearing and external ear normal.     Left Ear: Hearing and external ear normal.     Nose: No nasal deformity or rhinorrhea.  Eyes:     General: No scleral icterus.    Conjunctiva/sclera: Conjunctivae  normal.     Right eye: Right conjunctiva is not injected.     Left eye: Left conjunctiva is not injected.     Pupils: Pupils are equal, round, and reactive to light.  Neck:     Vascular: No JVD.  Cardiovascular:     Rate and Rhythm: Normal rate and regular rhythm.     Heart sounds: S1 normal and S2 normal.  Pulmonary:     Effort: Pulmonary effort is normal. No respiratory distress.     Breath sounds: No wheezing.  Abdominal:     General: There is no distension.     Palpations: Abdomen is soft.     Comments: Drain with purulent material present  Musculoskeletal:         General: Normal range of motion.     Right shoulder: Normal.     Left shoulder: Normal.     Cervical back: Normal range of motion and neck supple.     Right hip: Normal.     Left hip: Normal.     Right knee: Normal.     Left knee: Normal.  Lymphadenopathy:     Head:     Right side of head: No submandibular, preauricular or posterior auricular adenopathy.     Left side of head: No submandibular, preauricular or posterior auricular adenopathy.     Cervical: No cervical adenopathy.     Right cervical: No superficial or deep cervical adenopathy.    Left cervical: No superficial or deep cervical adenopathy.  Skin:    General: Skin is warm and dry.     Coloration: Skin is not pale.     Findings: No abrasion, bruising, ecchymosis, erythema, lesion or rash.     Nails: There is no clubbing.  Neurological:     Mental Status: She is alert and oriented to person, place, and time.     Sensory: No sensory deficit.     Coordination: Coordination normal.     Gait: Gait normal.  Psychiatric:        Attention and Perception: She is attentive.        Mood and Affect: Mood normal.        Speech: Speech normal.        Behavior: Behavior normal. Behavior is cooperative.        Thought Content: Thought content normal.        Judgment: Judgment normal.     Lab Results Lab Results  Component Value Date   WBC 12.1 (H) 06/30/2024   HGB 9.5 (L) 06/30/2024   HCT 30.2 (L) 06/30/2024   MCV 88.3 06/30/2024   PLT 504 (H) 06/30/2024    Lab Results  Component Value Date   CREATININE 0.84 06/30/2024   BUN 9 06/30/2024   NA 140 06/30/2024   K 3.7 06/30/2024   CL 102 06/30/2024   CO2 29 06/30/2024    Lab Results  Component Value Date   ALT 20 06/30/2024   AST 40 06/30/2024   ALKPHOS 71 06/30/2024   BILITOT 0.3 06/30/2024     Microbiology: Recent Results (from the past 240 hours)  Urine Culture     Status: Abnormal   Collection Time: 06/25/24  6:34 PM   Specimen: Urine, Clean Catch  Result  Value Ref Range Status   Specimen Description   Final    URINE, CLEAN CATCH Performed at [facility], [address], [place], [place] [address]    Special Requests   Final  NONE Performed at [facility], [address] [place], [place] [address]    Culture 50,000 COLONIES/mL ESCHERICHIA COLI (A)  Final   Report Status 06/27/2024 FINAL  Final   Organism ID, Bacteria ESCHERICHIA COLI (A)  Final      Susceptibility   Escherichia coli - MIC*    AMPICILLIN  >=32 RESISTANT Resistant     CEFAZOLIN (URINE) Value in next row Sensitive      8 SENSITIVEThis is a modified FDA-approved test that has been validated and its performance characteristics determined by the reporting laboratory.  This laboratory is certified under the Clinical Laboratory Improvement Amendments CLIA as qualified to perform high complexity clinical laboratory testing.    CEFEPIME  Value in next row Sensitive      8 SENSITIVEThis is a modified FDA-approved test that has been validated and its performance characteristics determined by the reporting laboratory.  This laboratory is certified under the Clinical Laboratory Improvement Amendments CLIA as qualified to perform high complexity clinical laboratory testing.    ERTAPENEM Value in next row Sensitive      8 SENSITIVEThis is a modified FDA-approved test that has been validated and its performance characteristics determined by the reporting laboratory.  This laboratory is certified under the Clinical Laboratory Improvement Amendments CLIA as qualified to perform high complexity clinical laboratory testing.    CEFTRIAXONE  Value in next row Sensitive      8 SENSITIVEThis is a modified FDA-approved test that has been validated and its performance characteristics determined by the reporting laboratory.  This laboratory is certified under the Clinical Laboratory Improvement Amendments CLIA as qualified to perform high complexity clinical  laboratory testing.    CIPROFLOXACIN Value in next row Resistant      8 SENSITIVEThis is a modified FDA-approved test that has been validated and its performance characteristics determined by the reporting laboratory.  This laboratory is certified under the Clinical Laboratory Improvement Amendments CLIA as qualified to perform high complexity clinical laboratory testing.    GENTAMICIN Value in next row Sensitive      8 SENSITIVEThis is a modified FDA-approved test that has been validated and its performance characteristics determined by the reporting laboratory.  This laboratory is certified under the Clinical Laboratory Improvement Amendments CLIA as qualified to perform high complexity clinical laboratory testing.    NITROFURANTOIN  Value in next row Sensitive      8 SENSITIVEThis is a modified FDA-approved test that has been validated and its performance characteristics determined by the reporting laboratory.  This laboratory is certified under the Clinical Laboratory Improvement Amendments CLIA as qualified to perform high complexity clinical laboratory testing.    TRIMETH /SULFA  Value in next row Sensitive      8 SENSITIVEThis is a modified FDA-approved test that has been validated and its performance characteristics determined by the reporting laboratory.  This laboratory is certified under the Clinical Laboratory Improvement Amendments CLIA as qualified to perform high complexity clinical laboratory testing.    AMPICILLIN /SULBACTAM Value in next row Resistant      8 SENSITIVEThis is a modified FDA-approved test that has been validated and its performance characteristics determined by the reporting laboratory.  This laboratory is certified under the Clinical Laboratory Improvement Amendments CLIA as qualified to perform high complexity clinical laboratory testing.    PIP/TAZO Value in next row Sensitive      <=4 SENSITIVEThis is a modified FDA-approved test that has been validated and its  performance characteristics determined by the reporting laboratory.  This laboratory is certified under  the Clinical Laboratory Improvement Amendments CLIA as qualified to perform high complexity clinical laboratory testing.    MEROPENEM Value in next row Sensitive      <=4 SENSITIVEThis is a modified FDA-approved test that has been validated and its performance characteristics determined by the reporting laboratory.  This laboratory is certified under the Clinical Laboratory Improvement Amendments CLIA as qualified to perform high complexity clinical laboratory testing.    * 50,000 COLONIES/mL ESCHERICHIA COLI  Blood culture (routine x 2)     Status: None   Collection Time: 06/25/24  6:35 PM   Specimen: BLOOD  Result Value Ref Range Status   Specimen Description   Final    BLOOD RIGHT ANTECUBITAL Performed at [facility], [address], [place], [place] [address]    Special Requests   Final    Blood Culture adequate volume BOTTLES DRAWN AEROBIC AND ANAEROBIC Performed at [facility], [address] [place], [place] [address]    Culture   Final    NO GROWTH 5 DAYS Performed at [facility], [address], [place], [place] [address]    Report Status 06/30/2024 FINAL  Final  Blood culture (routine x 2)     Status: None   Collection Time: 06/25/24  6:40 PM   Specimen: BLOOD RIGHT HAND  Result Value Ref Range Status   Specimen Description   Final    BLOOD RIGHT HAND Performed at [facility], [address], [place], [place] [address]    Special Requests   Final    Blood Culture adequate volume BOTTLES DRAWN AEROBIC AND ANAEROBIC Performed at [facility], [address], [place], [place] [address]    Culture   Final    NO GROWTH 5 DAYS Performed at [facility], [address], [place], [place] [address]    Report Status 06/30/2024 FINAL  Final  Resp panel by RT-PCR (RSV, Flu A&B, Covid) Anterior  Nasal Swab     Status: None   Collection Time: 06/25/24  7:01 PM   Specimen: Anterior Nasal Swab  Result Value Ref Range Status   SARS Coronavirus 2 by RT PCR NEGATIVE NEGATIVE Final    Comment: (NOTE) SARS-CoV-2 target nucleic acids are NOT DETECTED.  The SARS-CoV-2 RNA is generally detectable in upper respiratory specimens during the acute phase of infection. The lowest concentration of SARS-CoV-2 viral copies this assay can detect is 138 copies/mL. A negative result does not preclude SARS-Cov-2 infection and should not be used as the sole basis for treatment or other patient management decisions. A negative result may occur with  improper specimen collection/handling, submission of specimen other than nasopharyngeal swab, presence of viral mutation(s) within the areas targeted by this assay, and inadequate number of viral copies(<138 copies/mL). A negative result must be combined with clinical observations, patient history, and epidemiological information. The expected result is Negative.  Fact Sheet for Patients:  bloggercourse.com  Fact Sheet for Healthcare Providers:  seriousbroker.it  This test is no t yet approved or cleared by the [place]  FDA and  has been authorized for detection and/or diagnosis of SARS-CoV-2 by FDA under an Emergency Use Authorization (EUA). This EUA will remain  in effect (meaning this test can be used) for the duration of the COVID-19 declaration under Section 564(b)(1) of the Act, 21 U.S.C.section 360bbb-3(b)(1), unless the authorization is terminated  or revoked sooner.       Influenza A by PCR NEGATIVE NEGATIVE Final  Influenza B by PCR NEGATIVE NEGATIVE Final    Comment: (NOTE) The Xpert Xpress SARS-CoV-2/FLU/RSV plus assay is intended as an aid in the diagnosis of influenza from Nasopharyngeal swab specimens and should not be used as a sole basis for treatment. Nasal washings  and aspirates are unacceptable for Xpert Xpress SARS-CoV-2/FLU/RSV testing.  Fact Sheet for Patients: bloggercourse.com  Fact Sheet for Healthcare Providers: seriousbroker.it  This test is not yet approved or cleared by the [place]  FDA and has been authorized for detection and/or diagnosis of SARS-CoV-2 by FDA under an Emergency Use Authorization (EUA). This EUA will remain in effect (meaning this test can be used) for the duration of the COVID-19 declaration under Section 564(b)(1) of the Act, 21 U.S.C. section 360bbb-3(b)(1), unless the authorization is terminated or revoked.     Resp Syncytial Virus by PCR NEGATIVE NEGATIVE Final    Comment: (NOTE) Fact Sheet for Patients: bloggercourse.com  Fact Sheet for Healthcare Providers: seriousbroker.it  This test is not yet approved or cleared by the [place]  FDA and has been authorized for detection and/or diagnosis of SARS-CoV-2 by FDA under an Emergency Use Authorization (EUA). This EUA will remain in effect (meaning this test can be used) for the duration of the COVID-19 declaration under Section 564(b)(1) of the Act, 21 U.S.C. section 360bbb-3(b)(1), unless the authorization is terminated or revoked.  Performed at [facility], [address] [place], [place] [address]   Aerobic/Anaerobic Culture w Gram Stain (surgical/deep wound)     Status: None (Preliminary result)   Collection Time: 06/26/24  2:26 PM   Specimen: Abscess  Result Value Ref Range Status   Specimen Description   Final    ABSCESS Performed at [facility], [address] [place], [place] [address]    Special Requests   Final    NONE Performed at [facility], [address] [place], [place] [address]    Gram Stain   Final    FEW WBC PRESENT, PREDOMINANTLY PMN FEW GRAM POSITIVE  COCCI FEW GRAM POSITIVE RODS Performed at [facility], [address], [place], [place] [address]    Culture   Final    FEW STREPTOCOCCUS INTERMEDIUS RARE ESCHERICHIA COLI    Report Status PENDING  Incomplete   Organism ID, Bacteria ESCHERICHIA COLI  Final      Susceptibility   Escherichia coli - MIC*    AMPICILLIN  >=32 RESISTANT Resistant     CEFAZOLIN (NON-URINE) 8 RESISTANT Resistant     CEFEPIME  <=0.12 SENSITIVE Sensitive     ERTAPENEM <=0.12 SENSITIVE Sensitive     CEFTRIAXONE  <=0.25 SENSITIVE Sensitive     CIPROFLOXACIN >=4 RESISTANT Resistant     GENTAMICIN <=1 SENSITIVE Sensitive     MEROPENEM <=0.25 SENSITIVE Sensitive     TRIMETH /SULFA  <=20 SENSITIVE Sensitive     AMPICILLIN /SULBACTAM >=32 RESISTANT Resistant     PIP/TAZO Value in next row Sensitive      <=4 SENSITIVEThis is a modified FDA-approved test that has been validated and its performance characteristics determined by the reporting laboratory.  This laboratory is certified under the Clinical Laboratory Improvement Amendments CLIA as qualified to perform high complexity clinical laboratory testing.    * RARE ESCHERICHIA COLI  Culture, blood (Routine X 2) w Reflex to ID Panel     Status: None (Preliminary result)   Collection Time: 06/27/24  7:42 PM   Specimen: BLOOD RIGHT ARM  Result Value Ref Range Status   Specimen Description  Final    BLOOD RIGHT ARM Performed at [facility], [address] [place], [place] [address]    Special Requests   Final    BOTTLES DRAWN AEROBIC AND ANAEROBIC Blood Culture adequate volume Performed at [facility], [address], [place], [place] [address]    Culture   Final    NO GROWTH 3 DAYS Performed at [facility], [address] [place], [place] [address]    Report Status PENDING  Incomplete  Culture, blood (Routine X 2) w Reflex to ID Panel     Status: None (Preliminary result)   Collection Time: 06/27/24  7:42 PM   Specimen:  BLOOD RIGHT HAND  Result Value Ref Range Status   Specimen Description   Final    BLOOD RIGHT HAND Performed at [facility], [address] [place], [place] [address]    Special Requests   Final    BOTTLES DRAWN AEROBIC AND ANAEROBIC Blood Culture results may not be optimal due to an inadequate volume of blood received in culture bottles Performed at [facility], [address], [place], [place] [address]    Culture   Final    NO GROWTH 3 DAYS Performed at [facility], [address] [place], [place] [address]    Report Status PENDING  Incomplete    Jomarie Fleeta Rothman, MD [facility] [phone] pager  07/01/2024, 11:20 AM      [1]  Allergies Allergen Reactions   Hydrocodone Itching   Ampicillin  Rash  [2]  Social History Tobacco Use   Smoking status: Never   Smokeless tobacco: Never  Vaping Use   Vaping status: Never Used  Substance Use Topics   Alcohol use: No   Drug use: No  [3]  Allergies Allergen Reactions   Hydrocodone Itching   Ampicillin  Rash   "

## 2024-07-01 NOTE — Discharge Summary (Signed)
 " Physician Discharge Summary   Patient: Gwendolyn Sweeney MRN: [id number] DOB: 1961-02-21  Admit date:     06/25/2024  Discharge date: 07/01/24  Discharge Physician: Alejandro Marker, DO   PCP: [name], MD   Recommendations at discharge:   Follow-up with PCP within 1 to 2 weeks repeat CBC, CMP, mag, Phos within 1 week Follow-up with general surgeon outpatient setting within 1 to 2 weeks Follow-up with the interventional radiology drain clinic and continue to flush drain per protocol Follow-up with infectious disease in the outpatient setting Follow-up with GI in outpatient once abscess heals and improved with the diverticulitis  Discharge Diagnoses: Principal Problem:   Diverticulitis of colon with perforation Active Problems:   Colo-vesical fistula   Hypothyroidism   Anemia   Dehydration   Left lower quadrant abdominal abscess (HCC)   Polymicrobial bacterial infection   Diverticulitis of large intestine with perforation  Resolved Problems:   * No resolved hospital problems. [facility] Course: The patient is a 64 year old obese Caucasian female with past medical history significant for but not limited to cystocele/rectocele, frequent UTIs, hypothyroidism and other comorbidities who presents with abdominal discomfort and pain along with fevers.  Pain has been going on for 3 weeks and she was instructed by primary care But presents with worsening.  The pain and fever was initially attributed to influenza B and UTI which she was given Macrobid  for.    She continued to have left lower quadrant abdominal discomfort despite antibiotics and went to [facility] and had a CT scan which showed diverticulitis with microperforation and a diverticular abscess along with colovesicular fistula. Transferred to [facility] for Inpt Admission.  General Surgery was consulted and she was given IV antibiotics IV fluids and admitted.  IR was consulted she is s/p LLQ Drain placement. Continued to spike intermittent  temperatures so she was given IV fluids and had a blood culture repeated.  Diet is advanced to soft now.  Drain cultures still as below.  The surgical team feels that she can be safely discharged to SNF on oral Augmentin  however given her recent E. coli with resistance in her urine we will await her cultures and sensitivities from her drain to ensure we appropriately send her out on the right antibiotics given her abscess.  Assessment and Plan:  Diverticulitis of Colon with Perforation: Continue IV Cefepime  and Flagyl ; appreciate General Surgery consult and they recommended IR evaluation for drain placement given that the diverticular abscess is 6.9 x 5.3 x 6.4 cm. She is now s/p 63F drainage catheter into the LLQ Abscess (placed 06/26/24) and specimens sent for Gram Stain, Aerobic and Anaerobic Cx -Current Gram Stain + Cx:  Gram Stain FEW WBC PRESENT, PREDOMINANTLY PMN FEW GRAM POSITIVE COCCI FEW GRAM POSITIVE RODS  Culture FEW STREPTOCOCCUS INTERMEDIUS RARE ESCHERICHIA COLI ABUNDANT FUSOBACTERIUM NUCLEATUM BETA LACTAMASE NEGATIVE Performed at [facility], [address], [place], [place] [address]  Report Status 07/02/2024 FINAL  Organism ID, Bacteria ESCHERICHIA COLI  Organism ID, Bacteria STREPTOCOCCUS INTERMEDIUS  Resulting Agency [facility]     Susceptibility   Escherichia coli Streptococcus intermedius    MIC MIC    AMPICILLIN  >=32 RESIST... Resistant      AMPICILLIN /SULBACTAM >=32 RESIST... Resistant      CEFAZOLIN (NON-URINE) 8 RESISTANT Resistant      CEFEPIME  <=0.12 SENS... Sensitive      CEFTRIAXONE  <=0.25 SENS... Sensitive <=0.12 SENS... Sensitive    CIPROFLOXACIN >=4 RESISTANT Resistant  ERTAPENEM <=0.12 SENS... Sensitive      ERYTHROMYCIN   >=8 RESISTANT Resistant    GENTAMICIN <=1 SENSITIVE Sensitive      LEVOFLOXACIN   <=0.25 SENS... Sensitive    MEROPENEM <=0.25 SENS... Sensitive      PENICILLIN   <=0.06 SENS... Sensitive    PIP/TAZO  Sensitive 1       TRIMETH /SULFA  <=20 SENSIT... Sensitive      VANCOMYCIN   <=0.12 SENS... Sensitive     -WBC Improving and went from 15.9 -> 15.4 -> 13.9 -> 12.2 -> 12.6 -> 12.1 last check -Has been Afebrile in the last 48 hours  -Continue IV fluid hydration as below but given her Temperature spikes yesterday Afternoon she was given two 1 Liter boluses. Remains on Soft Diet today. -Continue with supportive care continue with acetaminophen  650 mg p.o./RC every 6 as needed for mild pain, IV fentanyl  12.5 to 50 mcg Q2 as needed for severe pain.  Continue with antiemetics with ondansetron  4 mg p.o./IV every 6 as needed. Further care per General Surgery and IR -General Surgery feels that she can be discharged from their standpoint and patient has had diet progression from full liquid to soft diet.  The surgical team is recommending IR follow-up and IR follow-up is being placed on the chart.  Patient to follow-up with Dr. Evangelina in 3 to 4 weeks and ultimately with one of the colorectal surgeons.  Surgery team feels that she needs at least 2 weeks of antibiotic therapy but after discussion with ID will need to ensure she adequately gets appropriate antibiotic treatment given her cultures. ID has made recommendations as below and she is being switched to ceftriaxone  2 g daily for 3 weeks and oral metronidazole  and will follow-up with ID in the outpatient setting.  Recent suspected E. Coli UTI: Urinalysis showed cloudy appearance with moderate hemoglobin, trace ketones, large leukocytes, negative nitrites, 100 protein, urine specific gravity of 1.029, no bacteria seen, greater than 50 WBCs and urine culture showing E. coli 50,000 colony-forming units with resistance to ampicillin , ampicillin  sulbactam and ciprofloxacin.  Getting antibiotics as above should cover urine and see ID plan   Colo-vesical fistula: CT scan worrisome for colovesicular fistula.  Defer to general surgery for further evaluation and they feel that hopefully  the direct diverticulitis will subside with antibiotics, bowel rest and IR drainage and they are planning for outpatient follow-up with the colorectal surgeon to discuss 1 stage resection and repair -Continued IV Cefepime  and Flagyl  for now and transition to oral Abx when Cx Results however ID team feels she needs more protracted antibiotics and oral options are not abundant here so they are recommending IV Ceftriaxone  2 g daily for 3 weeks + oral Metronidazole  500 every 12h.   Hypothyroidism: TSH is 2.850. C/w po Levothyroxine  50 mcg po Daily    Normocytic Anemia: Likely dilutional drop from IVF. Hgb/Hct Trend:  Recent Labs  Lab 06/25/24 1650 06/26/24 0101 06/27/24 0505 06/28/24 0458 06/29/24 0830 06/30/24 0447  HGB 11.9* 9.9* 9.9* 9.2* 9.4* 9.5*  HCT 37.0 30.5* 30.6* 29.9* 29.9* 30.2*  MCV 86.4 85.2 87.7 89.3 87.9 88.3  -Checked Anemia Panel and showed an iron level 16, UIBC 160, TIBC 176, saturation of show 9%, ferritin level 94, folate level greater than 20.0 and Vitamin B12 was 509. CTM for S/Sx of Bleeding; No overt bleeding noted. Repeat CBC in the AM  Renal Insufficiency: Baseline Cr ~1.0. BUN/Cr went from 21/1.15 -> 19/1.08 -> 14/1.08 -> 9/0.87 ->  9/0.76 -> 9/0.84 on last check. IVF now stopped. Avoid Nephrotoxic Medications, Contrast Dyes, Hypotension and Dehydration to Ensure Adequate Renal Perfusion and will need to Renally Adjust Meds. CTM & Trend Renal Function carefully & repeat CMP in the AM    Dehydration: Noted and Improved. Mild Renal Insufficiency that is stable and slightly improved; will Rehydrate as above; Follow labs  Thrombocytosis: Likely reactive in the setting of above. Plt Count went from 546 -> 472 -> 458 -> 436 -> 457 -> 504 on last check. CTM & Trend & repeat CBC in the AM   Hypoalbuminemia: Patient's Albumin Lvl went from 3.8 -> 3.0 -> 2.9 -> 2.7 -> 2.9 -> 2.8 on last check. CTM & Trend & repeat CMP in the AM  Class I Obesity: Complicates overall prognosis  and care. Estimated body mass index is 33.33 kg/m as calculated from the following:   Height as of this encounter: 4' 11 (1.499 m).   Weight as of this encounter: 74.8 kg. Weight Loss and Dietary Counseling given  Consultants: General Surgery, IR, ID Procedures performed: As delineated as above Disposition: Home health  Diet recommendation:  Cardiac diet DISCHARGE MEDICATION: Allergies as of 07/01/2024       Reactions   Hydrocodone Itching   Ampicillin  Rash        Medication List     STOP taking these medications    cetirizine  10 MG tablet Commonly known as: ZyrTEC  Allergy   sulfamethoxazole -trimethoprim  800-160 MG tablet Commonly known as: BACTRIM  DS       TAKE these medications    acetaminophen  325 MG tablet Commonly known as: TYLENOL  Take 2 tablets (650 mg total) by mouth every 6 (six) hours as needed for mild pain (pain score 1-3) (or Fever >/= 101).   benzonatate 200 MG capsule Commonly known as: TESSALON Take 200 mg by mouth 3 (three) times daily as needed.   cefTRIAXone  IVPB Commonly known as: ROCEPHIN  Inject 2 g into the vein daily for 21 days. Indication:  Sigmoid diverticulitis with polymicrobial abscess First Dose: Yes Last Day of Therapy:  07/22/24 Labs - Once weekly:  CBC/D and BMP, Labs - Once weekly: ESR and CRP Method of administration: IV Push Method of administration may be changed at the discretion of home infusion pharmacist based upon assessment of the patient and/or caregiver's ability to self-administer the medication ordered.   hydrocortisone  2.5 % rectal cream Commonly known as: ANUSOL -HC Place 1 Application rectally 2 (two) times daily.   levocetirizine 5 MG tablet Commonly known as: XYZAL  Take 1 tablet (5 mg total) by mouth every evening.   levothyroxine  50 MCG tablet Commonly known as: SYNTHROID  Take 50 mcg by mouth daily before breakfast.   metroNIDAZOLE  500 MG tablet Commonly known as: FLAGYL  Take 1 tablet (500 mg  total) by mouth 2 (two) times daily for 21 days.   multivitamin capsule Take 1 capsule by mouth daily.   Normal Saline Flush 0.9 % Soln 10 mLs by Intracatheter route daily. Flush 5-10ml saline daily to prevent catheter from getting clogged   ondansetron  4 MG tablet Commonly known as: ZOFRAN  Take 1 tablet (4 mg total) by mouth every 6 (six) hours as needed for nausea.   oxyCODONE  5 MG immediate release tablet Commonly known as: Oxy IR/ROXICODONE  Take 1 tablet (5 mg total) by mouth every 6 (six) hours as needed for up to 3 days for moderate pain (pain score 4-6).   polyethylene glycol powder 17 GM/SCOOP powder Commonly known as: GLYCOLAX /MIRALAX  Take  17 g by mouth daily. Dissolve 1 capful (17g) in 4-8 ounces of liquid and take by mouth daily.   Stool Softener/Laxative 50-8.6 MG tablet Generic drug: senna-docusate Take 1 tablet by mouth at bedtime as needed for mild constipation.   VITAMIN C PO Take by mouth.               Discharge Care Instructions  (From admission, onward)           Start     Ordered   07/01/24 0000  Change dressing on IV access line weekly and PRN  (Home infusion instructions - [facility] )        07/01/24 1306            Contact information for follow-up providers     Signe Mitzie LABOR, MD. Schedule an appointment as soon as possible for a visit in 3 week(s).   Specialty: General Surgery Why: For wound re-check Contact information: [address] [place] [address] [phone]         [facility]. Schedule an appointment as soon as possible for a visit in 10 day(s).   Specialty: Radiology Why: Please allow Radiology schedulers to call you to make the outpatient follow-up appointment. If you have not heard from them in 10 days, please call to schedule the appointment. Contact information: [address] [facility] [place]  [address] [phone]             Contact  information for after-discharge care     Home Medical Care     [facility]) .   Service: Home Health Services Why: Home Health Skilled Nursing (RN) Contact information: [address] [place]  [address] [phone]                    Discharge Exam: Fredricka Weights   06/25/24 1646  Weight: 74.8 kg   Vitals:   07/01/24 0537 07/01/24 1317  BP: 134/69 (!) 146/70  Pulse: 85 76  Resp: 20 18  Temp: 99.1 F (37.3 C) 99.1 F (37.3 C)  SpO2: 95% 98%   Examination: Physical Exam:  Constitutional: WN/WD obese Caucasian female in no acute distress Respiratory: Diminished to auscultation bilaterally, no wheezing, rales, rhonchi or crackles. Normal respiratory effort and patient is not tachypenic. No accessory muscle use.  Unlabored breathing Cardiovascular: RRR, no murmurs / rubs / gallops. S1 and S2 auscultated. No extremity edema.  Abdomen: Soft, non-tender, distended secondary body habitus and has a drain in place. Bowel sounds positive.  GU: Deferred. Musculoskeletal: No clubbing / cyanosis of digits/nails. No joint deformity upper and lower extremities. Skin: No rashes, lesions, ulcers on limited skin evaluation. No induration; Warm and dry.  Neurologic: CN 2-12 grossly intact with no focal deficits. Romberg sign and cerebellar reflexes not assessed.  Psychiatric: Normal judgment and insight. Alert and oriented x 3. Normal mood and appropriate affect.   Condition at discharge: stable  The results of significant diagnostics from this hospitalization (including imaging, microbiology, ancillary and laboratory) are listed below for reference.   Imaging Studies: US  EKG SITE RITE Result Date: 07/01/2024 If Site Rite image not attached, placement could not be confirmed due to current cardiac rhythm.  CT GUIDED PERITONEAL/RETROPERITONEAL FLUID DRAIN BY PERC CATH Result Date: 06/26/2024 INDICATION: [id number] Abscess [id number] Abscess,  diverticulitis of colon with perforation EXAM: CT-GUIDED LEFT LOWER QUADRANT ABSCESS DRAINAGE CATHETER PLACEMENT COMPARISON:  CT AP, 06/25/2024  MEDICATIONS: The patient is currently admitted to the hospital and receiving intravenous antibiotics. The antibiotics were administered within an appropriate time frame prior to the initiation of the procedure. ANESTHESIA/SEDATION: Moderate (conscious) sedation was employed during this procedure. A total of Versed  4 mg and Fentanyl  100 mcg was administered intravenously. Moderate Sedation Time: 22 minutes. The patient's level of consciousness and vital signs were monitored continuously by radiology nursing throughout the procedure under my direct supervision. CONTRAST:  None FLUOROSCOPY TIME:  CT dose; 1155 mGycm COMPLICATIONS: None immediate. PROCEDURE: RADIATION DOSE REDUCTION: This exam was performed according to the departmental dose-optimization program which includes automated exposure control, adjustment of the mA and/or kV according to patient size and/or use of iterative reconstruction technique. Informed written consent was obtained from the patient after a discussion of the risks, benefits and alternatives to treatment. The patient was placed supine on the CT gantry and a pre procedural CT was performed re-demonstrating the known abscess/fluid collection within the location. The procedure was planned. A timeout was performed prior to the initiation of the procedure. The LEFT lower quadrant was prepped and draped in the usual sterile fashion. The overlying soft tissues were anesthetized with 1% lidocaine with epinephrine. Appropriate trajectory was planned with the use of a 22 gauge spinal needle. An 18 gauge trocar needle was advanced into the abscess/fluid collection and a short Amplatz super stiff wire was coiled within the collection. Appropriate positioning was confirmed with a limited CT scan. The tract was serially dilated allowing placement of a 12 Fr Fr  drainage catheter. Appropriate positioning was confirmed with a limited postprocedural CT scan. 5 mL of purulent fluid was aspirated. The tube was connected to a drainage bag and sutured in place. A dressing was placed. The patient tolerated the procedure well without immediate post procedural complication. IMPRESSION: Successful CT-guided placement of a 12 Fr drainage catheter into the LEFT lower quadrant with aspiration of 5 mL of purulent fluid. Samples were sent to the laboratory as requested by the ordering clinical team. RECOMMENDATIONS: The patient will return to Vascular Interventional Radiology (VIR) for routine drainage catheter evaluation and exchange in 7-10 days. Thom Hall, MD [facility] Electronically Signed   By: Thom Hall M.D.   On: 06/26/2024 17:20   CT CHEST ABDOMEN PELVIS W CONTRAST Result Date: 06/25/2024 EXAM: CT CHEST WITH CONTRAST 06/25/2024 07:11:57 PM TECHNIQUE: CT of the chest was performed with the administration of intravenous contrast. Multiplanar reformatted images are provided for review. Automated exposure control, iterative reconstruction, and/or weight based adjustment of the mA/kV was utilized to reduce the radiation dose to as low as reasonably achievable. COMPARISON: CT chest 02/01/2022 and CT abdomen and pelvis 05/03/2000. CLINICAL HISTORY: FINDINGS: MEDIASTINUM: Heart and pericardium are unremarkable. The central airways are clear. LYMPH NODES: No mediastinal, hilar or axillary lymphadenopathy. LUNGS AND PLEURA: There is a linear band of atelectasis or scarring in the left lower lobe. The lungs are otherwise clear. No pleural effusion or pneumothorax. SOFT TISSUES/BONES: No acute abnormality of the bones or soft tissues. UPPER ABDOMEN: There is wall thickening of the dome of the liver. There is a small amount of air in the bladder. Cannot exclude fistulous connection to sigmoid colon on sagittal image 5/59.  There is wall thickening and inflammatory stranding surrounding the sigmoid colon. There is scattered colonic diverticula. There is an adjacent multiloculated enhancing air fluid collection in the left lower quadrant abutting the sigmoid colon measuring 6.9 x 5.3 x 6.4 cm.  There is a small amount of microperforation seen on image 2/97. The appendix appears normal. The uterus is surgically absent. Ovaries are not well delineated on this study. IMPRESSION: 1. Acute sigmoid diverticulitis with microperforation and adjacent multiloculated left lower quadrant abscess measuring 6.9 x 5.3 x 6.4 cm. 2. Intraluminal bladder gas and bladder wall thickening superiorly worrisome for reactive cystitis. There is a questionable colovesicular fistula. 3. Note is made that the left ovary is not well seen and therefore, tubo-ovarian abscess would also be in the differential. Electronically signed by: Greig Pique MD 06/25/2024 07:24 PM EST RP Workstation: [id number]   Microbiology: Results for orders placed or performed during the hospital encounter of 06/25/24  Urine Culture     Status: Abnormal   Collection Time: 06/25/24  6:34 PM   Specimen: Urine, Clean Catch  Result Value Ref Range Status   Specimen Description   Final    URINE, CLEAN CATCH Performed at [facility], [address], [place], [place] [address]    Special Requests   Final    NONE Performed at [facility], [address], [place], [place] [address]    Culture 50,000 COLONIES/mL ESCHERICHIA COLI (A)  Final   Report Status 06/27/2024 FINAL  Final   Organism ID, Bacteria ESCHERICHIA COLI (A)  Final      Susceptibility   Escherichia coli - MIC*    AMPICILLIN  >=32 RESISTANT Resistant     CEFAZOLIN (URINE) Value in next row Sensitive      8 SENSITIVEThis is a modified FDA-approved test that has been validated and its performance characteristics determined by the reporting laboratory.  This laboratory is  certified under the Clinical Laboratory Improvement Amendments CLIA as qualified to perform high complexity clinical laboratory testing.    CEFEPIME  Value in next row Sensitive      8 SENSITIVEThis is a modified FDA-approved test that has been validated and its performance characteristics determined by the reporting laboratory.  This laboratory is certified under the Clinical Laboratory Improvement Amendments CLIA as qualified to perform high complexity clinical laboratory testing.    ERTAPENEM Value in next row Sensitive      8 SENSITIVEThis is a modified FDA-approved test that has been validated and its performance characteristics determined by the reporting laboratory.  This laboratory is certified under the Clinical Laboratory Improvement Amendments CLIA as qualified to perform high complexity clinical laboratory testing.    CEFTRIAXONE  Value in next row Sensitive      8 SENSITIVEThis is a modified FDA-approved test that has been validated and its performance characteristics determined by the reporting laboratory.  This laboratory is certified under the Clinical Laboratory Improvement Amendments CLIA as qualified to perform high complexity clinical laboratory testing.    CIPROFLOXACIN Value in next row Resistant      8 SENSITIVEThis is a modified FDA-approved test that has been validated and its performance characteristics determined by the reporting laboratory.  This laboratory is certified under the Clinical Laboratory Improvement Amendments CLIA as qualified to perform high complexity clinical laboratory testing.    GENTAMICIN Value in next row Sensitive      8 SENSITIVEThis is a modified FDA-approved test that has been validated and its performance characteristics determined by the reporting laboratory.  This laboratory is certified under the Clinical Laboratory Improvement Amendments CLIA as qualified to perform high complexity clinical laboratory testing.    NITROFURANTOIN  Value in next row  Sensitive      8 SENSITIVEThis is a modified FDA-approved test that  has been validated and its performance characteristics determined by the reporting laboratory.  This laboratory is certified under the Clinical Laboratory Improvement Amendments CLIA as qualified to perform high complexity clinical laboratory testing.    TRIMETH /SULFA  Value in next row Sensitive      8 SENSITIVEThis is a modified FDA-approved test that has been validated and its performance characteristics determined by the reporting laboratory.  This laboratory is certified under the Clinical Laboratory Improvement Amendments CLIA as qualified to perform high complexity clinical laboratory testing.    AMPICILLIN /SULBACTAM Value in next row Resistant      8 SENSITIVEThis is a modified FDA-approved test that has been validated and its performance characteristics determined by the reporting laboratory.  This laboratory is certified under the Clinical Laboratory Improvement Amendments CLIA as qualified to perform high complexity clinical laboratory testing.    PIP/TAZO Value in next row Sensitive      <=4 SENSITIVEThis is a modified FDA-approved test that has been validated and its performance characteristics determined by the reporting laboratory.  This laboratory is certified under the Clinical Laboratory Improvement Amendments CLIA as qualified to perform high complexity clinical laboratory testing.    MEROPENEM Value in next row Sensitive      <=4 SENSITIVEThis is a modified FDA-approved test that has been validated and its performance characteristics determined by the reporting laboratory.  This laboratory is certified under the Clinical Laboratory Improvement Amendments CLIA as qualified to perform high complexity clinical laboratory testing.    * 50,000 COLONIES/mL ESCHERICHIA COLI  Blood culture (routine x 2)     Status: None   Collection Time: 06/25/24  6:35 PM   Specimen: BLOOD  Result Value Ref Range Status   Specimen  Description   Final    BLOOD RIGHT ANTECUBITAL Performed at [facility], [address], [place], [place] [address]    Special Requests   Final    Blood Culture adequate volume BOTTLES DRAWN AEROBIC AND ANAEROBIC Performed at [facility], [address], [place], [place] [address]    Culture   Final    NO GROWTH 5 DAYS Performed at [facility], [address] [place], [place] [address]    Report Status 06/30/2024 FINAL  Final  Blood culture (routine x 2)     Status: None   Collection Time: 06/25/24  6:40 PM   Specimen: BLOOD RIGHT HAND  Result Value Ref Range Status   Specimen Description   Final    BLOOD RIGHT HAND Performed at [facility], [address], [place], [place] [address]    Special Requests   Final    Blood Culture adequate volume BOTTLES DRAWN AEROBIC AND ANAEROBIC Performed at [facility], [address], [place], [place] [address]    Culture   Final    NO GROWTH 5 DAYS Performed at [facility], [address] [place], [place] [address]    Report Status 06/30/2024 FINAL  Final  Resp panel by RT-PCR (RSV, Flu A&B, Covid) Anterior Nasal Swab     Status: None   Collection Time: 06/25/24  7:01 PM   Specimen: Anterior Nasal Swab  Result Value Ref Range Status   SARS Coronavirus 2 by RT PCR NEGATIVE NEGATIVE Final    Comment: (NOTE) SARS-CoV-2 target nucleic acids are NOT DETECTED.  The SARS-CoV-2 RNA is generally detectable in upper respiratory specimens during the acute phase of infection. The lowest concentration of SARS-CoV-2 viral copies this assay can detect  is 138 copies/mL. A negative result does not preclude SARS-Cov-2 infection and should not be used as the sole basis for treatment or other patient management decisions. A negative result may occur with  improper specimen collection/handling, submission of specimen other than nasopharyngeal swab, presence of viral  mutation(s) within the areas targeted by this assay, and inadequate number of viral copies(<138 copies/mL). A negative result must be combined with clinical observations, patient history, and epidemiological information. The expected result is Negative.  Fact Sheet for Patients:  bloggercourse.com  Fact Sheet for Healthcare Providers:  seriousbroker.it  This test is no t yet approved or cleared by the [place]  FDA and  has been authorized for detection and/or diagnosis of SARS-CoV-2 by FDA under an Emergency Use Authorization (EUA). This EUA will remain  in effect (meaning this test can be used) for the duration of the COVID-19 declaration under Section 564(b)(1) of the Act, 21 U.S.C.section 360bbb-3(b)(1), unless the authorization is terminated  or revoked sooner.       Influenza A by PCR NEGATIVE NEGATIVE Final   Influenza B by PCR NEGATIVE NEGATIVE Final    Comment: (NOTE) The Xpert Xpress SARS-CoV-2/FLU/RSV plus assay is intended as an aid in the diagnosis of influenza from Nasopharyngeal swab specimens and should not be used as a sole basis for treatment. Nasal washings and aspirates are unacceptable for Xpert Xpress SARS-CoV-2/FLU/RSV testing.  Fact Sheet for Patients: bloggercourse.com  Fact Sheet for Healthcare Providers: seriousbroker.it  This test is not yet approved or cleared by the [place]  FDA and has been authorized for detection and/or diagnosis of SARS-CoV-2 by FDA under an Emergency Use Authorization (EUA). This EUA will remain in effect (meaning this test can be used) for the duration of the COVID-19 declaration under Section 564(b)(1) of the Act, 21 U.S.C. section 360bbb-3(b)(1), unless the authorization is terminated or revoked.     Resp Syncytial Virus by PCR NEGATIVE NEGATIVE Final    Comment: (NOTE) Fact Sheet for  Patients: bloggercourse.com  Fact Sheet for Healthcare Providers: seriousbroker.it  This test is not yet approved or cleared by the [place]  FDA and has been authorized for detection and/or diagnosis of SARS-CoV-2 by FDA under an Emergency Use Authorization (EUA). This EUA will remain in effect (meaning this test can be used) for the duration of the COVID-19 declaration under Section 564(b)(1) of the Act, 21 U.S.C. section 360bbb-3(b)(1), unless the authorization is terminated or revoked.  Performed at [facility], [address] [place], [place] [address]   Aerobic/Anaerobic Culture w Gram Stain (surgical/deep wound)     Status: None   Collection Time: 06/26/24  2:26 PM   Specimen: Abscess  Result Value Ref Range Status   Specimen Description   Final    ABSCESS Performed at [facility], [address] [place], [place] [address]    Special Requests   Final    NONE Performed at [facility], [address], [place], [place] [address]    Gram Stain   Final    FEW WBC PRESENT, PREDOMINANTLY PMN FEW GRAM POSITIVE COCCI FEW GRAM POSITIVE RODS    Culture   Final    FEW STREPTOCOCCUS INTERMEDIUS RARE ESCHERICHIA COLI ABUNDANT FUSOBACTERIUM NUCLEATUM BETA LACTAMASE NEGATIVE Performed at [facility], [address], [place], [place] [address]    Report Status 07/02/2024 FINAL  Final   Organism ID, Bacteria ESCHERICHIA COLI  Final   Organism ID, Bacteria STREPTOCOCCUS INTERMEDIUS  Final  Susceptibility   Escherichia coli - MIC*    AMPICILLIN  >=32 RESISTANT Resistant     CEFAZOLIN (NON-URINE) 8 RESISTANT Resistant     CEFEPIME  <=0.12 SENSITIVE Sensitive     ERTAPENEM <=0.12 SENSITIVE Sensitive     CEFTRIAXONE  <=0.25 SENSITIVE Sensitive     CIPROFLOXACIN >=4 RESISTANT Resistant     GENTAMICIN <=1 SENSITIVE Sensitive     MEROPENEM <=0.25 SENSITIVE  Sensitive     TRIMETH /SULFA  <=20 SENSITIVE Sensitive     AMPICILLIN /SULBACTAM >=32 RESISTANT Resistant     PIP/TAZO Value in next row Sensitive      <=4 SENSITIVEThis is a modified FDA-approved test that has been validated and its performance characteristics determined by the reporting laboratory.  This laboratory is certified under the Clinical Laboratory Improvement Amendments CLIA as qualified to perform high complexity clinical laboratory testing.    * RARE ESCHERICHIA COLI   Streptococcus intermedius - MIC*    PENICILLIN Value in next row Sensitive      <=4 SENSITIVEThis is a modified FDA-approved test that has been validated and its performance characteristics determined by the reporting laboratory.  This laboratory is certified under the Clinical Laboratory Improvement Amendments CLIA as qualified to perform high complexity clinical laboratory testing.    CEFTRIAXONE  Value in next row Sensitive      <=4 SENSITIVEThis is a modified FDA-approved test that has been validated and its performance characteristics determined by the reporting laboratory.  This laboratory is certified under the Clinical Laboratory Improvement Amendments CLIA as qualified to perform high complexity clinical laboratory testing.    ERYTHROMYCIN Value in next row Resistant      <=4 SENSITIVEThis is a modified FDA-approved test that has been validated and its performance characteristics determined by the reporting laboratory.  This laboratory is certified under the Clinical Laboratory Improvement Amendments CLIA as qualified to perform high complexity clinical laboratory testing.    LEVOFLOXACIN Value in next row Sensitive      <=4 SENSITIVEThis is a modified FDA-approved test that has been validated and its performance characteristics determined by the reporting laboratory.  This laboratory is certified under the Clinical Laboratory Improvement Amendments CLIA as qualified to perform high complexity clinical laboratory  testing.    VANCOMYCIN Value in next row Sensitive      <=4 SENSITIVEThis is a modified FDA-approved test that has been validated and its performance characteristics determined by the reporting laboratory.  This laboratory is certified under the Clinical Laboratory Improvement Amendments CLIA as qualified to perform high complexity clinical laboratory testing.    * FEW STREPTOCOCCUS INTERMEDIUS  Culture, blood (Routine X 2) w Reflex to ID Panel     Status: None   Collection Time: 06/27/24  7:42 PM   Specimen: BLOOD RIGHT ARM  Result Value Ref Range Status   Specimen Description   Final    BLOOD RIGHT ARM Performed at [facility], [address] [place], [place] [address]    Special Requests   Final    BOTTLES DRAWN AEROBIC AND ANAEROBIC Blood Culture adequate volume Performed at [facility], [address] [place], [place] [address]    Culture   Final    NO GROWTH 5 DAYS Performed at [facility], [address], [place], [place] [address]    Report Status 07/02/2024 FINAL  Final  Culture, blood (Routine X 2) w Reflex to ID Panel     Status: None   Collection Time: 06/27/24  7:42 PM   Specimen:  BLOOD RIGHT HAND  Result Value Ref Range Status   Specimen Description   Final    BLOOD RIGHT HAND Performed at [facility], [address] [place], [place] [address]    Special Requests   Final    BOTTLES DRAWN AEROBIC AND ANAEROBIC Blood Culture results may not be optimal due to an inadequate volume of blood received in culture bottles Performed at [facility], [address], [place], [place] [address]    Culture   Final    NO GROWTH 5 DAYS Performed at [facility], [address], [place], [place] [address]    Report Status 07/02/2024 FINAL  Final   Labs: CBC: Recent Labs  Lab 06/26/24 0101 06/27/24 0505 06/28/24 0458 06/29/24 0830 06/30/24 0447  WBC 15.4* 13.9* 12.2* 12.6* 12.1*  NEUTROABS  --  11.1*  9.8* 10.2* 9.4*  HGB 9.9* 9.9* 9.2* 9.4* 9.5*  HCT 30.5* 30.6* 29.9* 29.9* 30.2*  MCV 85.2 87.7 89.3 87.9 88.3  PLT 472* 458* 436* 457* 504*   Basic Metabolic Panel: Recent Labs  Lab 06/26/24 0101 06/27/24 0505 06/28/24 0458 06/29/24 0830 06/30/24 0447  NA 136 137 139 139 140  K 4.4 4.4 3.7 3.7 3.7  CL 98 101 104 102 102  CO2 27 25 26 27 29   GLUCOSE 87 78 87 92 102*  BUN 19 14 9 9 9   CREATININE 1.08* 1.08* 0.87 0.76 0.84  CALCIUM 9.0 9.2 8.7* 8.8* 8.9  MG 2.1 2.2 2.1 1.9 2.2  PHOS 4.0 4.3 3.0 3.0 3.6   Liver Function Tests: Recent Labs  Lab 06/26/24 0101 06/27/24 0505 06/28/24 0458 06/29/24 0830 06/30/24 0447  AST 21 19 19 24  40  ALT 17 13 11 13 20   ALKPHOS 101 99 87 71 71  BILITOT 0.5 0.4 0.3 0.2 0.3  PROT 7.4 7.1 6.5 6.9 7.0  ALBUMIN 3.0* 2.9* 2.7* 2.9* 2.8*   CBG: No results for input(s): GLUCAP in the last 168 hours.  Discharge time spent: greater than 30 minutes.  Signed: Alejandro Marker, DO [facility] 07/02/2024 "

## 2024-07-01 NOTE — Progress Notes (Signed)
 Patient discharged home, IV removed, PICC placed by PICC line RN, discharge paperwork provided and explained to patient as well as patient's husband, both patient and patient's husband verbalized understanding, primary RN provided JP drain education to patient as well as patient's husband, both patient and patient's husband verbalized understanding, primary RN provided patient with JP drain care materials prior to discharge.

## 2024-07-01 NOTE — TOC Transition Note (Signed)
 Transition of Care [facility]) - Discharge Note   Patient Details  Name: Gwendolyn Sweeney MRN: [id number] Date of Birth: 12-Jul-1960  Transition of Care [facility] CM/SW Contact:  Alfonse JONELLE Rex, RN Phone Number: 07/01/2024, 4:27 PM   Clinical Narrative:   DC to home. [facility] for home iv abx, Pam notified of discharge. [facility] for [facility] RN, added to AVS. No further CM needs at this time.        Barriers to Discharge: Continued Medical Work up   Patient Goals and CMS Choice Patient states their goals for this hospitalization and ongoing recovery are:: return home          Discharge Placement                       Discharge Plan and Services Additional resources added to the After Visit Summary for                                       Social Drivers of Health (SDOH) Interventions SDOH Screenings   Food Insecurity: No Food Insecurity (06/25/2024)  Housing: Low Risk (06/25/2024)  Transportation Needs: No Transportation Needs (06/25/2024)  Utilities: Not At Risk (06/25/2024)  Alcohol Screen: Low Risk (03/16/2023)  Depression (PHQ2-9): Low Risk (03/16/2023)  Financial Resource Strain: Low Risk (03/16/2023)  Physical Activity: Inactive (03/16/2023)  Social Connections: Socially Integrated (06/25/2024)  Stress: No Stress Concern Present (03/16/2023)  Tobacco Use: Low Risk (06/26/2024)     Readmission Risk Interventions    06/26/2024    1:27 PM  Readmission Risk Prevention Plan  Post Dischage Appt Complete  Medication Screening Complete  Transportation Screening Complete

## 2024-07-01 NOTE — TOC Progression Note (Signed)
 Transition of Care [facility]) - Progression Note    Patient Details  Name: Gwendolyn Sweeney MRN: [id number] Date of Birth: [birth year]  Transition of Care [facility]) CM/SW Contact  Alfonse JONELLE Rex, RN Phone Number: 07/01/2024, 2:30 PM  Clinical Narrative: Per ID note, plan on home IV Rocephin  x 3 weeks.  Confirmed with Pam w/[facility] referral received for home iv abx, plan for teaching with patient/family later today.  NCM will arrange [facility] RN        Expected Discharge Plan: Home/Self Care Barriers to Discharge: Continued Medical Work up               Expected Discharge Plan and Services       Living arrangements for the past 2 months: Single Family Home                                       Social Drivers of Health (SDOH) Interventions SDOH Screenings   Food Insecurity: No Food Insecurity (06/25/2024)  Housing: Low Risk (06/25/2024)  Transportation Needs: No Transportation Needs (06/25/2024)  Utilities: Not At Risk (06/25/2024)  Alcohol Screen: Low Risk (03/16/2023)  Depression (PHQ2-9): Low Risk (03/16/2023)  Financial Resource Strain: Low Risk (03/16/2023)  Physical Activity: Inactive (03/16/2023)  Social Connections: Socially Integrated (06/25/2024)  Stress: No Stress Concern Present (03/16/2023)  Tobacco Use: Low Risk (06/26/2024)    Readmission Risk Interventions    06/26/2024    1:27 PM  Readmission Risk Prevention Plan  Post Dischage Appt Complete  Medication Screening Complete  Transportation Screening Complete

## 2024-07-01 NOTE — Progress Notes (Incomplete)
 " PROGRESS NOTE    Gwendolyn Sweeney  FMW:[id number] DOB: [birth year] DOA: 06/25/2024 PCP: [name], MD   Brief Narrative:  The patient is a 64 year old obese Caucasian female with past medical history significant for but not limited to cystocele/rectocele, frequent UTIs, hypothyroidism and other comorbidities who presents with abdominal discomfort and pain along with fevers.  Pain has been going on for 3 weeks and she was instructed by primary care But presents with worsening.  The pain and fever was initially attributed to influenza B and UTI which she was given Macrobid  for.    She continued to have left lower quadrant abdominal discomfort despite antibiotics and went to [facility] and had a CT scan which showed diverticulitis with microperforation and a diverticular abscess along with colovesicular fistula. Transferred to [facility] for Inpt Admission.  General Surgery was consulted and she was given IV antibiotics IV fluids and admitted.  IR was consulted she is s/p LLQ Drain placement. Continued to spike intermittent temperatures so she was given IV fluids and had a blood culture repeated.  Diet is advanced to soft now.  Drain cultures still as below.  The surgical team feels that she can be safely discharged to SNF on oral Augmentin  however given her recent E. coli with resistance in her urine we will await her cultures and sensitivities from her drain to ensure we appropriately send her out on the right antibiotics given her abscess.  Assessment and Plan:  Diverticulitis of Colon with Perforation: Continue IV Cefepime  and Flagyl ; appreciate General Surgery consult and they recommended IR evaluation for drain placement given that the diverticular abscess is 6.9 x 5.3 x 6.4 cm. She is now s/p 25F drainage catheter into the LLQ Abscess (placed 06/26/24) and specimens sent for Gram Stain, Aerobic and Anaerobic Cx -Current Gram Stain + Cx:  Gram Stain FEW WBC PRESENT, PREDOMINANTLY PMN FEW GRAM POSITIVE  COCCI FEW GRAM POSITIVE RODS  Culture FEW STREPTOCOCCUS INTERMEDIUS RARE GRAM NEGATIVE RODS IDENTIFICATION AND SUSCEPTIBILITIES TO FOLLOW Performed at [facility], [address] [place], [place] [address]  -WBC Improving and went from 15.9 -> 15.4 -> 13.9 -> 12.2 -> 12.6 -> 12.1 -Has been Afebrile in the last 48 hours  -Continue IV fluid hydration as below but given her Temperature spikes yesterday Afternoon she was given two 1 Liter boluses. Remains on Soft Diet today. -Continue with supportive care continue with acetaminophen  650 mg p.o./RC every 6 as needed for mild pain, IV fentanyl  12.5 to 50 mcg Q2 as needed for severe pain.  Continue with antiemetics with ondansetron  4 mg p.o./IV every 6 as needed. Further care per General Surgery and IR -General Surgery feels that she can be discharged from their standpoint and patient has had diet progression from full liquid to soft diet.  The surgical team is recommending IR follow-up and IR follow-up is being placed on the chart.  Patient to follow-up with Dr. Evangelina in 3 to 4 weeks and ultimately with one of the colorectal surgeons.  Surgery team feels that she needs at least 2 weeks of antibiotic therapy but after discussion with ID will need to ensure she adequately gets appropriate antibiotic treatment given her cultures. -Will await her final cultures and sensitivities and clearance by the ID team to safely discharge her on the appropriate antibiotic regimen  Recent suspected E. Coli UTI: Urinalysis showed cloudy appearance with moderate hemoglobin, trace ketones, large leukocytes, negative nitrites, 100 protein, urine specific gravity of 1.029, no bacteria seen,  greater than 50 WBCs and urine culture showing E. coli 50,000 colony-forming units with resistance to ampicillin , ampicillin  sulbactam and ciprofloxacin.  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  Defer to  general surgery for further evaluation and they feel that hopefully the direct diverticulitis will subside with antibiotics, bowel rest and IR drainage and they are planning for outpatient follow-up with the colorectal surgeon to discuss 1 stage resection and repair -Continue IV Cefepime  and Flagyl  for now and transition to oral Abx when Cx Results    Hypothyroidism: TSH is 2.850. C/w po Levothyroxine  50 mcg po Daily    Normocytic Anemia: Likely dilutional drop from IVF. Hgb/Hct Trend:  Recent Labs  Lab 06/25/24 1650 06/26/24 0101 06/27/24 0505 06/28/24 0458 06/29/24 0830 06/30/24 0447  HGB 11.9* 9.9* 9.9* 9.2* 9.4* 9.5*  HCT 37.0 30.5* 30.6* 29.9* 29.9* 30.2*  MCV 86.4 85.2 87.7 89.3 87.9 88.3  -Checked Anemia Panel and showed an iron level 16, UIBC 160, TIBC 176, saturation of show 9%, ferritin level 94, folate level greater than 20.0 and Vitamin B12 was 509. CTM for S/Sx of Bleeding; No overt bleeding noted. Repeat CBC in the AM  Renal Insufficiency: Baseline Cr ~1.0. BUN/Cr went from 21/1.15 -> 19/1.08 -> 14/1.08 -> 9/0.87 -> 9/0.76 -> 9/0.84. IVF now stopped. Avoid Nephrotoxic Medications, Contrast Dyes, Hypotension and Dehydration to Ensure Adequate Renal Perfusion and will need to Renally Adjust Meds. CTM & Trend Renal Function carefully & repeat CMP in the AM    Dehydration: Noted and Improved. Mild Renal Insufficiency that is stable and slightly improved; will Rehydrate as above; Follow labs  Thrombocytosis: Likely reactive in the setting of above. Plt Count went from 546 -> 472 -> 458 -> 436 -> 457 -> 504. CTM & Trend & repeat CBC in the AM   Hypoalbuminemia: Patient's Albumin Lvl went from 3.8 -> 3.0 -> 2.9 -> 2.7 -> 2.9 -> 2.8. CTM & Trend & repeat CMP in the AM  Class I Obesity: Complicates overall prognosis and care. Estimated body mass index is 33.33 kg/m as calculated from the following:   Height as of this encounter: 4' 11 (1.499 m).   Weight as of this encounter: 74.8  kg. Weight Loss and Dietary Counseling given     DVT prophylaxis: SCDs Start: 06/26/24 0000    Code Status: Full Code Family Communication:   Disposition Plan:  Level of care: Med-Surg Status is: Inpatient {Inpatient:23812}    Consultants:  ***  Procedures:  ***  Antimicrobials:  Anti-infectives (From admission, onward)    Start     Dose/Rate Route Frequency Ordered Stop   07/01/24 2200  metroNIDAZOLE  (FLAGYL ) tablet 500 mg        500 mg Oral Every 12 hours 07/01/24 1214     07/01/24 1315  cefTRIAXone  (ROCEPHIN ) 2 g in sodium chloride  0.9 % 100 mL IVPB        2 g 200 mL/hr over 30 Minutes Intravenous Every 24 hours 07/01/24 1215     07/01/24 0000  cefTRIAXone  (ROCEPHIN ) IVPB        2 g Intravenous Every 24 hours 07/01/24 1306 07/22/24 2359   07/01/24 0000  metroNIDAZOLE  (FLAGYL ) 500 MG tablet        500 mg Oral 2 times daily 07/01/24 1306 07/22/24 2359   06/27/24 0000  amoxicillin -clavulanate (AUGMENTIN ) 875-125 MG tablet  Status:  Discontinued        1 tablet Oral  2 times daily 06/27/24 0850 07/01/24    06/26/24 1000  metroNIDAZOLE  (FLAGYL ) IVPB 500 mg  Status:  Discontinued       Placed in And Linked Group   500 mg 100 mL/hr over 60 Minutes Intravenous Every 12 hours 06/25/24 2358 07/01/24 1214   06/26/24 0800  ceFEPIme  (MAXIPIME ) 2 g in sodium chloride  0.9 % 100 mL IVPB  Status:  Discontinued        2 g 200 mL/hr over 30 Minutes Intravenous Every 12 hours 06/26/24 0002 07/01/24 1215   06/26/24 0045  ceFEPIme  (MAXIPIME ) 2 g in sodium chloride  0.9 % 100 mL IVPB  Status:  Discontinued       Placed in And Linked Group   2 g 200 mL/hr over 30 Minutes Intravenous  Once 06/25/24 2358 06/26/24 0002   06/25/24 2015  ceFEPIme  (MAXIPIME ) 2 g in sodium chloride  0.9 % 100 mL IVPB       Placed in And Linked Group   2 g 200 mL/hr over 30 Minutes Intravenous  Once 06/25/24 2009 06/25/24 2101   06/25/24 2015  metroNIDAZOLE  (FLAGYL ) IVPB 500 mg       Placed in And Linked  Group   500 mg 100 mL/hr over 60 Minutes Intravenous  Once 06/25/24 2009 06/25/24 2241        Subjective: ***  Objective: Vitals:   06/30/24 1315 06/30/24 2141 07/01/24 0537 07/01/24 1317  BP: 137/77 (!) 140/72 134/69 (!) 146/70  Pulse: 73 81 85 76  Resp: 16 18 20 18   Temp: 98.7 F (37.1 C) 98.3 F (36.8 C) 99.1 F (37.3 C) 99.1 F (37.3 C)  TempSrc: Oral Oral Oral Oral  SpO2: 96% 97% 95% 98%  Weight:      Height:        Intake/Output Summary (Last 24 hours) at 07/01/2024 1539 Last data filed at 07/01/2024 1538 Gross per 24 hour  Intake 993.27 ml  Output 55 ml  Net 938.27 ml   Filed Weights   06/25/24 1646  Weight: 74.8 kg    Examination: Physical Exam:  Constitutional: WN/WD, NAD and appears calm and comfortable Eyes: PERRL, lids and conjunctivae normal, sclerae anicteric  ENMT: External Ears, Nose appear normal. Grossly normal hearing. Mucous membranes are moist. Posterior pharynx clear of any exudate or lesions. Normal dentition.  Neck: Appears normal, supple, no cervical masses, normal ROM, no appreciable thyromegaly Respiratory: Clear to auscultation bilaterally, no wheezing, rales, rhonchi or crackles. Normal respiratory effort and patient is not tachypenic. No accessory muscle use.  Cardiovascular: RRR, no murmurs / rubs / gallops. S1 and S2 auscultated. No extremity edema. 2+ pedal pulses. No carotid bruits.  Abdomen: Soft, non-tender, non-distended. No masses palpated. No appreciable hepatosplenomegaly. Bowel sounds positive.  GU: Deferred. Musculoskeletal: No clubbing / cyanosis of digits/nails. No joint deformity upper and lower extremities. Good ROM, no contractures. Normal strength and muscle tone.  Skin: No rashes, lesions, ulcers. No induration; Warm and dry.  Neurologic: CN 2-12 grossly intact with no focal deficits. Sensation intact in all 4 Extremities, DTR normal. Strength 5/5 in all 4. Romberg sign cerebellar reflexes not assessed.  Psychiatric:  Normal judgment and insight. Alert and oriented x 3. Normal mood and appropriate affect.   Data Reviewed: I have personally reviewed following labs and imaging studies  CBC: Recent Labs  Lab 06/25/24 1650 06/26/24 0101 06/27/24 0505 06/28/24 0458 06/29/24 0830 06/30/24 0447  WBC 15.9* 15.4* 13.9* 12.2* 12.6* 12.1*  NEUTROABS 13.4*  --  11.1* 9.8*  10.2* 9.4*  HGB 11.9* 9.9* 9.9* 9.2* 9.4* 9.5*  HCT 37.0 30.5* 30.6* 29.9* 29.9* 30.2*  MCV 86.4 85.2 87.7 89.3 87.9 88.3  PLT 546* 472* 458* 436* 457* 504*   Basic Metabolic Panel: Recent Labs  Lab 06/26/24 0101 06/27/24 0505 06/28/24 0458 06/29/24 0830 06/30/24 0447  NA 136 137 139 139 140  K 4.4 4.4 3.7 3.7 3.7  CL 98 101 104 102 102  CO2 27 25 26 27 29   GLUCOSE 87 78 87 92 102*  BUN 19 14 9 9 9   CREATININE 1.08* 1.08* 0.87 0.76 0.84  CALCIUM 9.0 9.2 8.7* 8.8* 8.9  MG 2.1 2.2 2.1 1.9 2.2  PHOS 4.0 4.3 3.0 3.0 3.6   GFR: Estimated Creatinine Clearance: 58.8 mL/min (by C-G formula based on SCr of 0.84 mg/dL). Liver Function Tests: Recent Labs  Lab 06/26/24 0101 06/27/24 0505 06/28/24 0458 06/29/24 0830 06/30/24 0447  AST 21 19 19 24  40  ALT 17 13 11 13 20   ALKPHOS 101 99 87 71 71  BILITOT 0.5 0.4 0.3 0.2 0.3  PROT 7.4 7.1 6.5 6.9 7.0  ALBUMIN 3.0* 2.9* 2.7* 2.9* 2.8*   No results for input(s): LIPASE, AMYLASE in the last 168 hours. No results for input(s): AMMONIA in the last 168 hours. Coagulation Profile: Recent Labs  Lab 06/25/24 2139  INR 1.2   Cardiac Enzymes: No results for input(s): CKTOTAL, CKMB, CKMBINDEX, TROPONINI in the last 168 hours. BNP (last 3 results) No results for input(s): PROBNP in the last 8760 hours. HbA1C: No results for input(s): HGBA1C in the last 72 hours. CBG: No results for input(s): GLUCAP in the last 168 hours. Lipid Profile: No results for input(s): CHOL, HDL, LDLCALC, TRIG, CHOLHDL, LDLDIRECT in the last 72 hours. Thyroid  Function  Tests: No results for input(s): TSH, T4TOTAL, FREET4, T3FREE, THYROIDAB in the last 72 hours. Anemia Panel: No results for input(s): VITAMINB12, FOLATE, FERRITIN, TIBC, IRON, RETICCTPCT in the last 72 hours. Sepsis Labs: Recent Labs  Lab 06/25/24 1652  LATICACIDVEN 1.2    Recent Results (from the past 240 hours)  Urine Culture     Status: Abnormal   Collection Time: 06/25/24  6:34 PM   Specimen: Urine, Clean Catch  Result Value Ref Range Status   Specimen Description   Final    URINE, CLEAN CATCH Performed at [facility], [address] [place], [place] [address]    Special Requests   Final    NONE Performed at [facility], [address], [place], [place] [address]    Culture 50,000 COLONIES/mL ESCHERICHIA COLI (A)  Final   Report Status 06/27/2024 FINAL  Final   Organism ID, Bacteria ESCHERICHIA COLI (A)  Final      Susceptibility   Escherichia coli - MIC*    AMPICILLIN  >=32 RESISTANT Resistant     CEFAZOLIN (URINE) Value in next row Sensitive      8 SENSITIVEThis is a modified FDA-approved test that has been validated and its performance characteristics determined by the reporting laboratory.  This laboratory is certified under the Clinical Laboratory Improvement Amendments CLIA as qualified to perform high complexity clinical laboratory testing.    CEFEPIME  Value in next row Sensitive      8 SENSITIVEThis is a modified FDA-approved test that has been validated and its performance characteristics determined by the reporting laboratory.  This laboratory is certified under the Clinical Laboratory Improvement Amendments CLIA as qualified to perform high complexity clinical laboratory testing.  ERTAPENEM Value in next row Sensitive      8 SENSITIVEThis is a modified FDA-approved test that has been validated and its performance characteristics determined by the reporting laboratory.  This laboratory is certified under  the Clinical Laboratory Improvement Amendments CLIA as qualified to perform high complexity clinical laboratory testing.    CEFTRIAXONE  Value in next row Sensitive      8 SENSITIVEThis is a modified FDA-approved test that has been validated and its performance characteristics determined by the reporting laboratory.  This laboratory is certified under the Clinical Laboratory Improvement Amendments CLIA as qualified to perform high complexity clinical laboratory testing.    CIPROFLOXACIN Value in next row Resistant      8 SENSITIVEThis is a modified FDA-approved test that has been validated and its performance characteristics determined by the reporting laboratory.  This laboratory is certified under the Clinical Laboratory Improvement Amendments CLIA as qualified to perform high complexity clinical laboratory testing.    GENTAMICIN Value in next row Sensitive      8 SENSITIVEThis is a modified FDA-approved test that has been validated and its performance characteristics determined by the reporting laboratory.  This laboratory is certified under the Clinical Laboratory Improvement Amendments CLIA as qualified to perform high complexity clinical laboratory testing.    NITROFURANTOIN  Value in next row Sensitive      8 SENSITIVEThis is a modified FDA-approved test that has been validated and its performance characteristics determined by the reporting laboratory.  This laboratory is certified under the Clinical Laboratory Improvement Amendments CLIA as qualified to perform high complexity clinical laboratory testing.    TRIMETH /SULFA  Value in next row Sensitive      8 SENSITIVEThis is a modified FDA-approved test that has been validated and its performance characteristics determined by the reporting laboratory.  This laboratory is certified under the Clinical Laboratory Improvement Amendments CLIA as qualified to perform high complexity clinical laboratory testing.    AMPICILLIN /SULBACTAM Value in next row  Resistant      8 SENSITIVEThis is a modified FDA-approved test that has been validated and its performance characteristics determined by the reporting laboratory.  This laboratory is certified under the Clinical Laboratory Improvement Amendments CLIA as qualified to perform high complexity clinical laboratory testing.    PIP/TAZO Value in next row Sensitive      <=4 SENSITIVEThis is a modified FDA-approved test that has been validated and its performance characteristics determined by the reporting laboratory.  This laboratory is certified under the Clinical Laboratory Improvement Amendments CLIA as qualified to perform high complexity clinical laboratory testing.    MEROPENEM Value in next row Sensitive      <=4 SENSITIVEThis is a modified FDA-approved test that has been validated and its performance characteristics determined by the reporting laboratory.  This laboratory is certified under the Clinical Laboratory Improvement Amendments CLIA as qualified to perform high complexity clinical laboratory testing.    * 50,000 COLONIES/mL ESCHERICHIA COLI  Blood culture (routine x 2)     Status: None   Collection Time: 06/25/24  6:35 PM   Specimen: BLOOD  Result Value Ref Range Status   Specimen Description   Final    BLOOD RIGHT ANTECUBITAL Performed at [facility], [address] [place], [place] [address]    Special Requests   Final    Blood Culture adequate volume BOTTLES DRAWN AEROBIC AND ANAEROBIC Performed at [facility], [address], [place], [place] [address]    Culture   Final    NO  GROWTH 5 DAYS Performed at [facility], [address], [place], [place] [address]    Report Status 06/30/2024 FINAL  Final  Blood culture (routine x 2)     Status: None   Collection Time: 06/25/24  6:40 PM   Specimen: BLOOD RIGHT HAND  Result Value Ref Range Status   Specimen Description   Final    BLOOD RIGHT HAND Performed at [facility], [address], [place], [place] [address]    Special Requests   Final    Blood Culture adequate volume BOTTLES DRAWN AEROBIC AND ANAEROBIC Performed at [facility], [address], [place], [place] [address]    Culture   Final    NO GROWTH 5 DAYS Performed at [facility], [address], [place], [place] [address]    Report Status 06/30/2024 FINAL  Final  Resp panel by RT-PCR (RSV, Flu A&B, Covid) Anterior Nasal Swab     Status: None   Collection Time: 06/25/24  7:01 PM   Specimen: Anterior Nasal Swab  Result Value Ref Range Status   SARS Coronavirus 2 by RT PCR NEGATIVE NEGATIVE Final    Comment: (NOTE) SARS-CoV-2 target nucleic acids are NOT DETECTED.  The SARS-CoV-2 RNA is generally detectable in upper respiratory specimens during the acute phase of infection. The lowest concentration of SARS-CoV-2 viral copies this assay can detect is 138 copies/mL. A negative result does not preclude SARS-Cov-2 infection and should not be used as the sole basis for treatment or other patient management decisions. A negative result may occur with  improper specimen collection/handling, submission of specimen other than nasopharyngeal swab, presence of viral mutation(s) within the areas targeted by this assay, and inadequate number of viral copies(<138 copies/mL). A negative result must be combined with clinical observations, patient history, and epidemiological information. The expected result is Negative.  Fact Sheet for Patients:  bloggercourse.com  Fact Sheet for Healthcare Providers:  seriousbroker.it  This test is no t yet approved or cleared by the [place]  FDA and  has been authorized for detection and/or diagnosis of SARS-CoV-2 by FDA under an Emergency Use Authorization (EUA). This EUA will remain  in effect (meaning this test can be used) for the duration of the COVID-19 declaration under Section  564(b)(1) of the Act, 21 U.S.C.section 360bbb-3(b)(1), unless the authorization is terminated  or revoked sooner.       Influenza A by PCR NEGATIVE NEGATIVE Final   Influenza B by PCR NEGATIVE NEGATIVE Final    Comment: (NOTE) The Xpert Xpress SARS-CoV-2/FLU/RSV plus assay is intended as an aid in the diagnosis of influenza from Nasopharyngeal swab specimens and should not be used as a sole basis for treatment. Nasal washings and aspirates are unacceptable for Xpert Xpress SARS-CoV-2/FLU/RSV testing.  Fact Sheet for Patients: bloggercourse.com  Fact Sheet for Healthcare Providers: seriousbroker.it  This test is not yet approved or cleared by the [place]  FDA and has been authorized for detection and/or diagnosis of SARS-CoV-2 by FDA under an Emergency Use Authorization (EUA). This EUA will remain in effect (meaning this test can be used) for the duration of the COVID-19 declaration under Section 564(b)(1) of the Act, 21 U.S.C. section 360bbb-3(b)(1), unless the authorization is terminated or revoked.     Resp Syncytial Virus by PCR NEGATIVE NEGATIVE Final    Comment: (NOTE) Fact Sheet for Patients: bloggercourse.com  Fact Sheet for Healthcare Providers: seriousbroker.it  This test is not yet approved or cleared by the  [place]  FDA and has been authorized for detection and/or diagnosis of SARS-CoV-2 by FDA under an Emergency Use Authorization (EUA). This EUA will remain in effect (meaning this test can be used) for the duration of the COVID-19 declaration under Section 564(b)(1) of the Act, 21 U.S.C. section 360bbb-3(b)(1), unless the authorization is terminated or revoked.  Performed at [facility], [address] [place], [place] [address]   Aerobic/Anaerobic Culture w Gram Stain (surgical/deep wound)     Status: None (Preliminary result)    Collection Time: 06/26/24  2:26 PM   Specimen: Abscess  Result Value Ref Range Status   Specimen Description   Final    ABSCESS Performed at [facility], [address], [place], [place] [address]    Special Requests   Final    NONE Performed at [facility], [address] [place], [place] [address]    Gram Stain   Final    FEW WBC PRESENT, PREDOMINANTLY PMN FEW GRAM POSITIVE COCCI FEW GRAM POSITIVE RODS Performed at [facility], [address], [place], [place] [address]    Culture   Final    FEW STREPTOCOCCUS INTERMEDIUS RARE ESCHERICHIA COLI    Report Status PENDING  Incomplete   Organism ID, Bacteria ESCHERICHIA COLI  Final      Susceptibility   Escherichia coli - MIC*    AMPICILLIN  >=32 RESISTANT Resistant     CEFAZOLIN (NON-URINE) 8 RESISTANT Resistant     CEFEPIME  <=0.12 SENSITIVE Sensitive     ERTAPENEM <=0.12 SENSITIVE Sensitive     CEFTRIAXONE  <=0.25 SENSITIVE Sensitive     CIPROFLOXACIN >=4 RESISTANT Resistant     GENTAMICIN <=1 SENSITIVE Sensitive     MEROPENEM <=0.25 SENSITIVE Sensitive     TRIMETH /SULFA  <=20 SENSITIVE Sensitive     AMPICILLIN /SULBACTAM >=32 RESISTANT Resistant     PIP/TAZO Value in next row Sensitive      <=4 SENSITIVEThis is a modified FDA-approved test that has been validated and its performance characteristics determined by the reporting laboratory.  This laboratory is certified under the Clinical Laboratory Improvement Amendments CLIA as qualified to perform high complexity clinical laboratory testing.    * RARE ESCHERICHIA COLI  Culture, blood (Routine X 2) w Reflex to ID Panel     Status: None (Preliminary result)   Collection Time: 06/27/24  7:42 PM   Specimen: BLOOD RIGHT ARM  Result Value Ref Range Status   Specimen Description   Final    BLOOD RIGHT ARM Performed at [facility], [address], [place], [place] [address]    Special Requests   Final    BOTTLES DRAWN AEROBIC  AND ANAEROBIC Blood Culture adequate volume Performed at [facility], [address] [place], [place] [address]    Culture   Final    NO GROWTH 4 DAYS Performed at [facility], [address] [place], [place] [address]    Report Status PENDING  Incomplete  Culture, blood (Routine X 2) w Reflex to ID Panel     Status: None (Preliminary result)   Collection Time: 06/27/24  7:42 PM   Specimen: BLOOD RIGHT HAND  Result Value Ref Range Status   Specimen Description   Final    BLOOD RIGHT HAND Performed at [facility], [address], [place], [place] [address]    Special Requests   Final    BOTTLES DRAWN AEROBIC AND ANAEROBIC Blood Culture results may not  be optimal due to an inadequate volume of blood received in culture bottles Performed at [facility] [facility], [address], [place], [place] [address]    Culture   Final    NO GROWTH 4 DAYS Performed at [facility], [address], [place], [place] [address]    Report Status PENDING  Incomplete     Radiology Studies: US  EKG SITE RITE Result Date: 07/01/2024 If Site Rite image not attached, placement could not be confirmed due to current cardiac rhythm.    Scheduled Meds:  hydrocortisone   1 Application Rectal BID   levothyroxine   50 mcg Oral Q0600   metroNIDAZOLE   500 mg Oral Q12H   polyethylene glycol  17 g Oral BID   senna-docusate  1 tablet Oral BID   sodium chloride  flush  5 mL Intracatheter Q8H   Continuous Infusions:  cefTRIAXone  (ROCEPHIN )  IV 2 g (07/01/24 1236)   lactated ringers        LOS: 6 days    Time spent: *** minutes spent on chart review, discussion with nursing staff, consultants, updating family and interview/physical exam; more than 50% of that time was spent in counseling and/or coordination of care.   Alejandro Lazarus Marker, DO [facility] Available via Epic secure chat 7am-7pm After these hours, please refer to coverage provider listed  on [email or web address] 07/01/2024, 3:39 PM   "

## 2024-07-01 NOTE — Progress Notes (Signed)
 PHARMACY CONSULT NOTE FOR:  OUTPATIENT  PARENTERAL ANTIBIOTIC THERAPY (OPAT)  Indication: Diverticular abscess Regimen: Rocephin  2g IV every 24 hours + metronidazole  500 mg po twice daily End date: 07/22/24  IV antibiotic discharge orders are pended. To discharging provider:  please sign these orders via discharge navigator,  Select New Orders & click on the button choice - Manage This Unsigned Work.     Thank you for allowing pharmacy to be a part of this patients care.  Almarie Lunger, PharmD, BCPS, BCIDP Infectious Diseases Clinical Pharmacist 07/01/2024 1:02 PM   **Pharmacist phone directory can now be found on [email or web address] ([email or web address]).  Listed under [facility].

## 2024-07-01 NOTE — Progress Notes (Signed)
 Peripherally Inserted Central Catheter Placement  The IV Nurse has discussed with the patient and/or persons authorized to consent for the patient, the purpose of this procedure and the potential benefits and risks involved with this procedure.  The benefits include less needle sticks, lab draws from the catheter, and the patient may be discharged home with the catheter. Risks include, but not limited to, infection, bleeding, blood clot (thrombus formation), and puncture of an artery; nerve damage and irregular heartbeat and possibility to perform a PICC exchange if needed/ordered by physician.  Alternatives to this procedure were also discussed.  Bard Power PICC patient education guide, fact sheet on infection prevention and patient information card has been provided to patient /or left at bedside.    PICC Placement Documentation  PICC Single Lumen 07/01/24 Right Basilic 36 cm 0 cm (Active)  Indication for Insertion or Continuance of Line Home intravenous therapies 07/01/24 1635  Exposed Catheter (cm) 0 cm 07/01/24 1635  Site Assessment Clean, Dry, Intact 07/01/24 1635  Line Status Flushed;Saline locked;Blood return noted 07/01/24 1635  Dressing Type Transparent;Securing device 07/01/24 1635  Dressing Status Antimicrobial disc/dressing in place;Clean, Dry, Intact 07/01/24 1635  Line Care Connections checked and tightened 07/01/24 1635  Line Adjustment (NICU/IV Team Only) No 07/01/24 1635  Dressing Intervention New dressing;Adhesive placed at insertion site (IV team only) 07/01/24 1635  Dressing Change Due 07/08/24 07/01/24 1635       Renaee Notice Albarece 07/01/2024, 5:03 PM

## 2024-07-02 ENCOUNTER — Other Ambulatory Visit (HOSPITAL_COMMUNITY): Payer: Self-pay

## 2024-07-02 LAB — CULTURE, BLOOD (ROUTINE X 2)
Culture: NO GROWTH
Culture: NO GROWTH
Special Requests: ADEQUATE

## 2024-07-02 LAB — AEROBIC/ANAEROBIC CULTURE W GRAM STAIN (SURGICAL/DEEP WOUND)

## 2024-07-03 ENCOUNTER — Other Ambulatory Visit: Payer: Self-pay | Admitting: Surgery

## 2024-07-03 DIAGNOSIS — K651 Peritoneal abscess: Secondary | ICD-10-CM

## 2024-07-05 ENCOUNTER — Other Ambulatory Visit (HOSPITAL_COMMUNITY): Payer: Self-pay

## 2024-07-08 ENCOUNTER — Telehealth: Payer: Self-pay | Admitting: Infectious Disease

## 2024-07-08 MED ORDER — FLUCONAZOLE 100 MG PO TABS: 100.0000 mg | ORAL_TABLET | Freq: Every day | ORAL | 3 refills | Status: AC

## 2024-07-10 ENCOUNTER — Ambulatory Visit
Admission: RE | Admit: 2024-07-10 | Discharge: 2024-07-10 | Disposition: A | Source: Ambulatory Visit | Attending: Urology

## 2024-07-10 ENCOUNTER — Ambulatory Visit
Admission: RE | Admit: 2024-07-10 | Discharge: 2024-07-10 | Disposition: A | Source: Ambulatory Visit | Attending: Surgery

## 2024-07-10 ENCOUNTER — Other Ambulatory Visit: Payer: Self-pay | Admitting: Surgery

## 2024-07-10 ENCOUNTER — Inpatient Hospital Stay
Admission: RE | Admit: 2024-07-10 | Discharge: 2024-07-10 | Disposition: A | Source: Ambulatory Visit | Attending: Surgery

## 2024-07-10 DIAGNOSIS — K651 Peritoneal abscess: Secondary | ICD-10-CM

## 2024-07-10 MED ORDER — IOPAMIDOL (ISOVUE-300) INJECTION 61%
100.0000 mL | Freq: Once | INTRAVENOUS | Status: AC | PRN
  Administered 2024-07-10: 100 mL via INTRAVENOUS

## 2024-07-10 MED ORDER — IOPAMIDOL (ISOVUE-300) INJECTION 61%
30.0000 mL | Freq: Once | INTRAVENOUS | Status: AC | PRN
  Administered 2024-07-10: 7 mL

## 2024-07-10 NOTE — Progress Notes (Signed)
 "   Chief Complaint: Patient was seen today for abscess drain follow up  Referring Physician(s): Carim,Charles A    Patient Status: Outpatient [facility]  Subjective: LLQ abscess drain, 12Fr placed 06/26/24.  CT 07/10/24 demonstrates persistent abscess, although smaller and likely fistula.  Flushing daily and documenting daily output which fluctuates between 10-66mL  Objective: Physical Exam: There were no vitals taken for this visit.   Current Medications[1]  Labs: CBC No results for input(s): WBC, HGB, HCT, PLT in the last 72 hours. BMET No results for input(s): NA, K, CL, CO2, GLUCOSE, BUN, CREATININE, CALCIUM in the last 72 hours. LFT No results for input(s): PROT, ALBUMIN, AST, ALT, ALKPHOS, BILITOT, BILIDIR, IBILI, LIPASE in the last 72 hours. PT/INR No results for input(s): LABPROT, INR in the last 72 hours.   Studies/Results: DG Sinus/Fist Tube Chk-Non GI Result Date: 07/10/2024 INDICATION: Follow-up percutaneous abscess drainage catheter placement. Patient continues to have output on JP bulb with daily flushing. EXAM: Sinogram COMPLICATIONS: None immediate. PROCEDURE: Informed written consent was obtained from the patient after a thorough discussion of the procedural risks, benefits and alternatives. All questions were addressed. Maximal Sterile Barrier Technique was utilized including caps, mask, sterile gowns, sterile gloves, sterile drape, hand hygiene and skin antiseptic. A timeout was performed prior to the initiation of the procedure. In a supine position, dilute contrast was injected into the indwelling left-sided 12 Smolinsky percutaneous drainage catheter. Contrast flows easily through the catheter into the abscess cavity. As soon as the abscess cavity begins to fill, fistula is identified with contrast then flowing into the sigmoid colon. Images were obtained. Catheter was connected to gravity drainage instructions  given to patient. Sterile dressing applied. 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 The 12 Davisson catheter will now be connected to gravity drainage and the patient will stop daily flushing. Patient will continue to change dressing every other day and record output. The patient will be scheduled to return in 2-3 weeks for repeat CT and sinogram. Patient instructed to keep appointment with general surgery for further surgical evaluation. Electronically Signed   By: Cordella Banner   On: 07/10/2024 15:05   CT ABDOMEN PELVIS W CONTRAST Result Date: 07/10/2024 CLINICAL DATA:  Follow-up diverticular abscess drainage. CT-guided drainage catheter placed 06/26/2024. Patient relates decreased output without other complaints. EXAM: CT ABDOMEN AND PELVIS WITH CONTRAST TECHNIQUE: Multidetector CT imaging of the abdomen and pelvis was performed using the standard protocol following bolus administration of intravenous contrast. RADIATION DOSE REDUCTION: This exam was performed according to the departmental dose-optimization program which includes automated exposure control, adjustment of the mA and/or kV according to patient size and/or use of iterative reconstruction technique. CONTRAST:  ISOVUE -300 IOPAMIDOL  (ISOVUE -300) INJECTION 61% COMPARISON:  CT chest abdomen and pelvis 07/05/2024 FINDINGS: Lower chest: Lung bases clear.  No pleural or pericardial effusion. Hepatobiliary: Liver unremarkable in size and configuration. No abnormal enhancing hepatic mass and no intrahepatic or extrahepatic biliary dilatation. Gallbladder demonstrates layering biliary sludge without wall thickening or pericholecystic fluid. Pancreas: Pancreas and pancreatic duct unremarkable. Spleen: No acute abnormality Adrenals/Urinary Tract: No adrenal mass. No renal mass or evidence of hydronephrosis. Urinary bladder unremarkable. Previously identified free air within the bladder no longer present. Stomach/Bowel:  Stomach and small bowel unremarkable and without evidence of obstruction. Numerous colonic diverticula. Left pelvic abscess markedly decreased in size when compared with the previous study. Percutaneous drainage catheter in good position and coiled within the abnormal fluid collection consistent with abscess from  diverticulitis. Mild-to-moderate inflammatory stranding adjacent to the abscess. Vascular/Lymphatic: Minimal atherosclerotic disease in the abdominal aorta. No significant calcified plaque in the iliac arteries. The IMA, SMA, and celiac artery are all widely patent. 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 No significant stenosis of the left renal artery. Kidneys without perfusion asymmetry. Reproductive: Hysterectomy Other: Musculoskeletal: No acute muscular or skeletal destruction. IMPRESSION: Decreased size in left pelvic abscess. Drainage catheter in satisfactory position. Worrisome for continuous communication with bowel. Electronically Signed   By: Cordella Banner   On: 07/10/2024 14:45    Assessment/Plan: Sinogram demonstrates persistent abscess with sigmoid fistula.  Changing JP bulb to gravity drainage and stopping daily flushing.  Continue with every other day dressing changes and follow up with general surgery.  Return to clinic in 2-3 weeks fo repeat imaging/eval.    LOS: 0 days   The time spent with the patient was to perform the sinogram procedure.  Cordella DELENA Banner PA-C 07/10/2024 3:15 PM      [1]  Current Outpatient Medications:    acetaminophen  (TYLENOL ) 325 MG tablet, Take 2 tablets (650 mg total) by mouth every 6 (six) hours as needed for mild pain (pain score 1-3) (or Fever >/= 101)., Disp: 20 tablet, Rfl: 0   Ascorbic Acid (VITAMIN C PO), Take by mouth., Disp: , Rfl:    benzonatate (TESSALON) 200 MG capsule, Take 200 mg by mouth 3 (three) times daily as needed., Disp: , Rfl:    cefTRIAXone  (ROCEPHIN ) IVPB, Inject 2 g into the vein daily for  21 days. Indication:  Sigmoid diverticulitis with polymicrobial abscess First Dose: Yes Last Day of Therapy:  07/22/24 Labs - Once weekly:  CBC/D and BMP, Labs - Once weekly: ESR and CRP Method of administration: IV Push Method of administration may be changed at the discretion of home infusion pharmacist based upon assessment of the patient and/or caregiver's ability to self-administer the medication ordered., Disp: 21 Units, Rfl: 0   fluconazole  (DIFLUCAN ) 100 MG tablet, Take 1 tablet (100 mg total) by mouth daily., Disp: 10 tablet, Rfl: 3   hydrocortisone  (ANUSOL -HC) 2.5 % rectal cream, Place 1 Application rectally 2 (two) times daily., Disp: 30 g, Rfl: 0   levocetirizine (XYZAL ) 5 MG tablet, Take 1 tablet (5 mg total) by mouth every evening. (Patient not taking: Reported on 06/25/2024), Disp: 30 tablet, Rfl: 0   levothyroxine  (SYNTHROID ) 50 MCG tablet, Take 50 mcg by mouth daily before breakfast., Disp: , Rfl:    metroNIDAZOLE  (FLAGYL ) 500 MG tablet, Take 1 tablet (500 mg total) by mouth 2 (two) times daily for 21 days., Disp: 42 tablet, Rfl: 0   Multiple Vitamin (MULTIVITAMIN) capsule, Take 1 capsule by mouth daily., Disp: , Rfl:    ondansetron  (ZOFRAN ) 4 MG tablet, Take 1 tablet (4 mg total) by mouth every 6 (six) hours as needed for nausea., Disp: 20 tablet, Rfl: 0   polyethylene glycol powder (GLYCOLAX /MIRALAX ) 17 GM/SCOOP powder, Take 17 g by mouth daily. Dissolve 1 capful (17g) in 4-8 ounces of liquid and take by mouth daily., Disp: 238 g, Rfl: 0   senna-docusate (SENOKOT-S) 8.6-50 MG tablet, Take 1 tablet by mouth at bedtime as needed for mild constipation., Disp: 30 tablet, Rfl: 0   sodium chloride  flush (NS) 0.9 % SOLN, 10 mLs by Intracatheter route daily. Flush 5-10ml saline daily to prevent catheter from getting clogged, Disp: 600 mL, Rfl: 0  "

## 2024-07-15 ENCOUNTER — Ambulatory Visit: Payer: Self-pay | Admitting: Infectious Diseases

## 2024-07-24 ENCOUNTER — Other Ambulatory Visit
